# Patient Record
Sex: Male | Born: 1980 | Race: White | Hispanic: No | Marital: Single | Smoking: Current every day smoker
Health system: Southern US, Community
[De-identification: ages and names within clinical notes are randomized; demographics above are authoritative.]

## PROBLEM LIST (undated history)

## (undated) DIAGNOSIS — J45909 Unspecified asthma, uncomplicated: Secondary | ICD-10-CM

## (undated) DIAGNOSIS — E119 Type 2 diabetes mellitus without complications: Secondary | ICD-10-CM

## (undated) DIAGNOSIS — F1910 Other psychoactive substance abuse, uncomplicated: Secondary | ICD-10-CM

## (undated) DIAGNOSIS — F319 Bipolar disorder, unspecified: Secondary | ICD-10-CM

## (undated) DIAGNOSIS — I10 Essential (primary) hypertension: Secondary | ICD-10-CM

## (undated) HISTORY — PX: BACK SURGERY: SHX140

## (undated) HISTORY — PX: APPENDECTOMY: SHX54

## (undated) HISTORY — PX: CHOLECYSTECTOMY: SHX55

---

## 2007-12-23 ENCOUNTER — Ambulatory Visit: Payer: Self-pay | Admitting: Family Medicine

## 2008-10-01 ENCOUNTER — Ambulatory Visit: Payer: Self-pay | Admitting: Surgery

## 2008-10-04 ENCOUNTER — Ambulatory Visit: Payer: Self-pay | Admitting: Surgery

## 2009-03-24 ENCOUNTER — Emergency Department: Payer: Self-pay | Admitting: Emergency Medicine

## 2015-09-01 ENCOUNTER — Observation Stay
Admission: EM | Admit: 2015-09-01 | Discharge: 2015-09-02 | Disposition: A | Discharge status: Home / Self Care | Payer: Medicaid Other | Attending: Internal Medicine | Admitting: Internal Medicine

## 2015-09-01 ENCOUNTER — Encounter: Payer: Self-pay | Admitting: *Deleted

## 2015-09-01 ENCOUNTER — Emergency Department: Payer: Medicaid Other

## 2015-09-01 DIAGNOSIS — R253 Fasciculation: Secondary | ICD-10-CM | POA: Insufficient documentation

## 2015-09-01 DIAGNOSIS — I10 Essential (primary) hypertension: Secondary | ICD-10-CM | POA: Insufficient documentation

## 2015-09-01 DIAGNOSIS — R918 Other nonspecific abnormal finding of lung field: Secondary | ICD-10-CM | POA: Insufficient documentation

## 2015-09-01 DIAGNOSIS — R000 Tachycardia, unspecified: Secondary | ICD-10-CM | POA: Insufficient documentation

## 2015-09-01 DIAGNOSIS — R569 Unspecified convulsions: Principal | ICD-10-CM

## 2015-09-01 DIAGNOSIS — R55 Syncope and collapse: Secondary | ICD-10-CM | POA: Diagnosis present

## 2015-09-01 DIAGNOSIS — R4182 Altered mental status, unspecified: Secondary | ICD-10-CM | POA: Insufficient documentation

## 2015-09-01 DIAGNOSIS — J45909 Unspecified asthma, uncomplicated: Secondary | ICD-10-CM | POA: Insufficient documentation

## 2015-09-01 DIAGNOSIS — Z880 Allergy status to penicillin: Secondary | ICD-10-CM | POA: Insufficient documentation

## 2015-09-01 DIAGNOSIS — F17210 Nicotine dependence, cigarettes, uncomplicated: Secondary | ICD-10-CM | POA: Insufficient documentation

## 2015-09-01 DIAGNOSIS — M25572 Pain in left ankle and joints of left foot: Secondary | ICD-10-CM | POA: Diagnosis not present

## 2015-09-01 DIAGNOSIS — W19XXXA Unspecified fall, initial encounter: Secondary | ICD-10-CM | POA: Insufficient documentation

## 2015-09-01 HISTORY — DX: Unspecified asthma, uncomplicated: J45.909

## 2015-09-01 HISTORY — DX: Essential (primary) hypertension: I10

## 2015-09-01 LAB — COMPREHENSIVE METABOLIC PANEL
ALK PHOS: 123 U/L (ref 38–126)
ALT: 31 U/L (ref 17–63)
AST: 25 U/L (ref 15–41)
Albumin: 4.3 g/dL (ref 3.5–5.0)
Anion gap: 19 — ABNORMAL HIGH (ref 5–15)
BILIRUBIN TOTAL: 0.4 mg/dL (ref 0.3–1.2)
BUN: 11 mg/dL (ref 6–20)
CALCIUM: 9 mg/dL (ref 8.9–10.3)
CO2: 19 mmol/L — ABNORMAL LOW (ref 22–32)
CREATININE: 1.21 mg/dL (ref 0.61–1.24)
Chloride: 103 mmol/L (ref 101–111)
Glucose, Bld: 163 mg/dL — ABNORMAL HIGH (ref 65–99)
Potassium: 3.4 mmol/L — ABNORMAL LOW (ref 3.5–5.1)
Sodium: 141 mmol/L (ref 135–145)
TOTAL PROTEIN: 8.5 g/dL — AB (ref 6.5–8.1)

## 2015-09-01 LAB — FIBRIN DERIVATIVES D-DIMER (ARMC ONLY): FIBRIN DERIVATIVES D-DIMER (ARMC): 705 — AB (ref 0–499)

## 2015-09-01 LAB — TROPONIN I: TROPONIN I: 0.03 ng/mL (ref <0.031)

## 2015-09-01 LAB — CBC
HCT: 44.7 % (ref 40.0–52.0)
HEMOGLOBIN: 14.6 g/dL (ref 13.0–18.0)
MCH: 25.7 pg — AB (ref 26.0–34.0)
MCHC: 32.7 g/dL (ref 32.0–36.0)
MCV: 78.7 fL — ABNORMAL LOW (ref 80.0–100.0)
PLATELETS: 476 10*3/uL — AB (ref 150–440)
RBC: 5.69 MIL/uL (ref 4.40–5.90)
RDW: 15.1 % — ABNORMAL HIGH (ref 11.5–14.5)
WBC: 16 10*3/uL — AB (ref 3.8–10.6)

## 2015-09-01 LAB — GLUCOSE, CAPILLARY: Glucose-Capillary: 150 mg/dL — ABNORMAL HIGH (ref 65–99)

## 2015-09-01 MED ORDER — SODIUM CHLORIDE 0.9 % IV SOLN
Freq: Once | INTRAVENOUS | Status: AC
  Administered 2015-09-01: 22:00:00 via INTRAVENOUS

## 2015-09-01 MED ORDER — IOHEXOL 350 MG/ML SOLN
100.0000 mL | Freq: Once | INTRAVENOUS | Status: AC | PRN
  Administered 2015-09-01: 100 mL via INTRAVENOUS

## 2015-09-01 NOTE — ED Notes (Signed)
Pt sating 90-92% on room air, pt placed on 2L [place] by MD Carollee Massed at this time

## 2015-09-01 NOTE — ED Notes (Addendum)
Pt to ED via EMS after family witnessed "seizure like activity" Per EMS pt altered LOC, tachy, and recent "issues with hyperglycemia" Upon arrival pt tachy 147, awake and alert x 2 to person and place. Pt denies chest pain, SOB, lightheadedness or dizziness. Pt with no c/c at this time, denies all pain. Pt with no known hx of seizures, hx of hypertension and pill addiction. Current suboxone user. Pt BGL 150 upon arrival. Pt anxious, confused and "unsure of why here right now." MD Carollee Massed at bedside upon arrival.

## 2015-09-01 NOTE — ED Notes (Signed)
Pt returned from CT at this time.  

## 2015-09-01 NOTE — ED Notes (Signed)
MD Kaminski at bedside. 

## 2015-09-01 NOTE — ED Notes (Signed)
Patient transported to CT 

## 2015-09-01 NOTE — ED Provider Notes (Signed)
[facility] Emergency Department Provider Note  ____________________________________________  Time seen: 2150  I have reviewed the triage vital signs and the nursing notes.  History by:  Patient, then with family.  HISTORY  Chief Complaint Seizures; Tachycardia; and Altered Mental Status     HPI Joseph [name] is a 35 y.o. male who presents with an initially vague history. He reports he is here because his family called 911 and made him come because he had a fast heart rate. He reports he twisted his ankle earlier today and then stumbled again this evening. When he stumbled they called 911. He denies any loss of consciousness. He denies any palpitations or chest pain or other acute issues.  The family reports that when he stumbled he lost consciousness. He was unresponsive and "frothing at the mouth". He woke up some and tried to leave. Police and EMS arrived and he was getting resistant with them.  Apparently, the first glucose measurement read critical high, but EMS arrived and found a glucose of 170.   The patient's family and then the patient confirms, that he has a "spider bite" and has had a rash recently. He also has had swelling in both legs, but worse in the left.  He has a history of hypertension. He also is on Suboxone treatment.    Past Medical History  Diagnosis Date  . Hypertension     There are no active problems to display for this patient.   Past Surgical History  Procedure Laterality Date  . Back surgery      Current Outpatient Rx  Name  Route  Sig  Dispense  Refill  . gabapentin (NEURONTIN) 300 MG capsule   Oral   Take 300-600 mg by mouth 2 (two) times daily.         Marland Kitchen LISINOPRIL PO   Oral   Take 1 tablet by mouth daily.         . SUBOXONE 8-2 MG FILM   Sublingual   Place 1 Film under the tongue 3 (three) times daily.           Dispense as written.   . TRIAMTERENE-HCTZ PO   Oral   Take 1 tablet by mouth  daily. Patient states takes a HCTZ combo tablet that is green, this is only known tablet with that combination. Asked if metoprolol, losartan, bisoprolol, etc. Sounded familiar but patient only knew color and that HCTZ was in it.           Allergies Penicillins  History reviewed. No pertinent family history.  Social History Social History  Substance Use Topics  . Smoking status: Current Every Day Smoker -- 0.50 packs/day    Types: Cigarettes  . Smokeless tobacco: None  . Alcohol Use: No    Review of Systems  Constitutional: Negative for fever/chills. ENT: Negative for congestion. Cardiovascular: Negative for chest pain or palpitations. Noted tachycardia on arrival. Respiratory: Negative for cough. Gastrointestinal: Negative for abdominal pain, vomiting and diarrhea. Genitourinary: Negative for dysuria. Musculoskeletal: No back pain. Skin: Diaphoresis with the episode tonight. Recent "spider bite" and some rash/hives. Neurological: Patient denies headache or weakness. Some question of seizure or syncope tonight.   10-point ROS otherwise negative.  ____________________________________________   PHYSICAL EXAM:  VITAL SIGNS: ED Triage Vitals  Enc Vitals Group     BP 09/01/15 2141 110/91 mmHg     Pulse Rate 09/01/15 2141 148     Resp 09/01/15 2141 23     Temp  09/01/15 2141 98.8 F (37.1 C)     Temp Source 09/01/15 2141 Oral     SpO2 09/01/15 2141 94 %     Weight 09/01/15 2141 440 lb (199.583 kg)     Height 09/01/15 2141  (1.88 m)     Head Cir --      Peak Flow --      Pain Score --      Pain Loc --      Pain Edu? --      Excl. in GC? --     Constitutional: Alert and oriented. Tachycardic, a little diaphoretic, but in no acute distress. ENT   Head: Normocephalic and atraumatic.   Nose: No congestion/rhinnorhea.       Mouth: No erythema, no swelling   Cardiovascular: Tachycardic at 125 to 1:30, regular rhythm, no murmur noted Respiratory:  Normal  respiratory effort, no tachypnea.    Breath sounds are clear and equal bilaterally.  Gastrointestinal: Soft, no distention. Nontender Back: No muscle spasm, no tenderness, no CVA tenderness. Musculoskeletal: Minimal tenderness to the medial portion of the left ankle. Large lower legs without any pitting. No gross deformities. Neurologic:  Communicative. Patient appears have no recollection of the high acuity of the events of the house. He has good cognition and remains calm as he hears the family's report and concerns. Normal appearing spontaneous movement in all 4 extremities. No gross focal neurologic deficits are appreciated.  Skin:  Skin is warm, dry. No rash noted. Psychiatric: Mood and affect are normal. Speech and behavior are normal.  ____________________________________________    LABS (pertinent positives/negatives)  Labs Reviewed  GLUCOSE, CAPILLARY - Abnormal; Notable for the following:    Glucose-Capillary 150 (*)    All other components within normal limits  COMPREHENSIVE METABOLIC PANEL - Abnormal; Notable for the following:    Potassium 3.4 (*)    CO2 19 (*)    Glucose, Bld 163 (*)    Total Protein 8.5 (*)    Anion gap 19 (*)    All other components within normal limits  CBC - Abnormal; Notable for the following:    WBC 16.0 (*)    MCV 78.7 (*)    MCH 25.7 (*)    RDW 15.1 (*)    Platelets 476 (*)    All other components within normal limits  FIBRIN DERIVATIVES D-DIMER ([facility] ONLY) - Abnormal; Notable for the following:    Fibrin derivatives D-dimer ([facility]) 705 (*)    All other components within normal limits  TROPONIN I  URINE DRUG SCREEN, QUALITATIVE ([facility] ONLY)  CBG MONITORING, ED     ____________________________________________   EKG  ED ECG REPORT I, Rylah Fukuda W, the attending physician, personally viewed and interpreted this ECG.   Date: 09/01/2015  EKG Time: 2139  Rate: 147  Rhythm: Sinus tachycardia  Axis: Normal  Intervals: QTC of 502   ST&T Change: T-wave is flat negative in lead 32 and aVF.  Abnormal EKG ____________________________________________    RADIOLOGY  Chest x-ray: FINDINGS: The lungs are clear. Heart size is normal. No pneumothorax or pleural effusion. No focal bony abnormality.  IMPRESSION: Negative chest. ____________________________________________   PROCEDURES  CRITICAL CARE Performed by: Darien Ramus   Total critical care time: 35 minutes  Critical care time was exclusive of separately billable procedures and treating other patients.  Critical care was necessary to treat or prevent imminent or life-threatening deterioration.  Critical care was time spent personally by me on the following activities: development  of treatment plan with patient and/or surrogate as well as nursing, discussions with consultants, evaluation of patient's response to treatment, examination of patient, obtaining history from patient or surrogate, ordering and performing treatments and interventions, ordering and review of laboratory studies, ordering and review of radiographic studies, pulse oximetry and re-evaluation of patient's condition.   ____________________________________________   INITIAL IMPRESSION / ASSESSMENT AND PLAN / ED COURSE  Pertinent labs & imaging results that were available during my care of the patient were reviewed by me and considered in my medical decision making (see chart for details).  Unclear cause of an unclear event and a 35 year old male. He has ongoing tachycardia without chest pain or palpitations. I believe he had either seizure or syncopal episode. With the fast heart rate and borderline blood pressure, I am concerned about various possible causes for hypotension, including possibilities related to the "spider bite". He could have an infectious or allergic cause. With swelling of the leg he could also have possible DVT and  PE. We're checking cardiac enzyme, d-dimer, and other  lab tests. Chest x-ray and ankle x-ray are pending.  ----------------------------------------- 11:23 PM on 09/01/2015 -----------------------------------------  Patient does have a positive d-dimer. This, in conjunction with his tachycardia, leads me to evaluate him further with a CT angiogram of his chest. This is been ordered. I discussed case my colleague, Dr. Manson Passey, who will assume care of the patient this time.  ____________________________________________   FINAL CLINICAL IMPRESSION(S) / ED DIAGNOSES  Final diagnoses:  Syncope and collapse  Tachycardia      Darien Ramus, MD 09/01/15 2325

## 2015-09-01 NOTE — ED Notes (Signed)
Pt requesting for administration of Subonxoe, MD Carollee Massed made aware, no further orders given at this time.

## 2015-09-02 ENCOUNTER — Encounter: Payer: Self-pay | Admitting: Internal Medicine

## 2015-09-02 DIAGNOSIS — R569 Unspecified convulsions: Secondary | ICD-10-CM

## 2015-09-02 LAB — URINE DRUG SCREEN, QUALITATIVE (ARMC ONLY)
Amphetamines, Ur Screen: NOT DETECTED
Barbiturates, Ur Screen: NOT DETECTED
Benzodiazepine, Ur Scrn: NOT DETECTED
COCAINE METABOLITE, UR ~~LOC~~: POSITIVE — AB
Cannabinoid 50 Ng, Ur ~~LOC~~: POSITIVE — AB
MDMA (Ecstasy)Ur Screen: NOT DETECTED
Methadone Scn, Ur: NOT DETECTED
Opiate, Ur Screen: NOT DETECTED
Phencyclidine (PCP) Ur S: NOT DETECTED
TRICYCLIC, UR SCREEN: NOT DETECTED

## 2015-09-02 LAB — TROPONIN I
Troponin I: 0.05 ng/mL — ABNORMAL HIGH (ref <0.031)
Troponin I: <0.03 ng/mL (ref <0.031)

## 2015-09-02 MED ORDER — GABAPENTIN 300 MG PO CAPS
600.0000 mg | ORAL_CAPSULE | Freq: Two times a day (BID) | ORAL | Status: DC
  Administered 2015-09-02: 600 mg via ORAL
  Filled 2015-09-02: qty 2

## 2015-09-02 MED ORDER — GABAPENTIN 300 MG PO CAPS
300.0000 mg | ORAL_CAPSULE | Freq: Every evening | ORAL | Status: DC | PRN
  Administered 2015-09-02: 300 mg via ORAL
  Filled 2015-09-02: qty 1

## 2015-09-02 MED ORDER — ONDANSETRON HCL 4 MG/2ML IJ SOLN: 4.0000 mg | Freq: Four times a day (QID) | INTRAMUSCULAR | Status: DC | PRN

## 2015-09-02 MED ORDER — ONDANSETRON HCL 4 MG PO TABS: 4.0000 mg | ORAL_TABLET | Freq: Four times a day (QID) | ORAL | Status: DC | PRN

## 2015-09-02 MED ORDER — BUPRENORPHINE HCL 8 MG SL SUBL
8.0000 mg | SUBLINGUAL_TABLET | Freq: Once | SUBLINGUAL | Status: AC
  Administered 2015-09-02: 8 mg via SUBLINGUAL
  Filled 2015-09-02: qty 1

## 2015-09-02 MED ORDER — BUPRENORPHINE HCL 8 MG SL SUBL
8.0000 mg | SUBLINGUAL_TABLET | Freq: Three times a day (TID) | SUBLINGUAL | Status: DC
  Administered 2015-09-02: 8 mg via SUBLINGUAL
  Filled 2015-09-02: qty 1

## 2015-09-02 MED ORDER — GABAPENTIN 300 MG PO CAPS: 300.0000 mg | ORAL_CAPSULE | Freq: Two times a day (BID) | ORAL | Status: DC

## 2015-09-02 MED ORDER — ENOXAPARIN SODIUM 40 MG/0.4ML ~~LOC~~ SOLN
40.0000 mg | Freq: Two times a day (BID) | SUBCUTANEOUS | Status: DC
  Administered 2015-09-02: 40 mg via SUBCUTANEOUS
  Filled 2015-09-02: qty 0.4

## 2015-09-02 MED ORDER — SODIUM CHLORIDE 0.9% FLUSH
3.0000 mL | Freq: Two times a day (BID) | INTRAVENOUS | Status: DC
  Administered 2015-09-02 (×2): 3 mL via INTRAVENOUS

## 2015-09-02 MED ORDER — ACETAMINOPHEN 650 MG RE SUPP: 650.0000 mg | Freq: Four times a day (QID) | RECTAL | Status: DC | PRN

## 2015-09-02 MED ORDER — LISINOPRIL 5 MG PO TABS
5.0000 mg | ORAL_TABLET | Freq: Every day | ORAL | Status: DC
  Administered 2015-09-02: 5 mg via ORAL
  Filled 2015-09-02: qty 1

## 2015-09-02 MED ORDER — ACETAMINOPHEN 325 MG PO TABS
650.0000 mg | ORAL_TABLET | Freq: Four times a day (QID) | ORAL | Status: DC | PRN
  Administered 2015-09-02: 650 mg via ORAL
  Filled 2015-09-02: qty 2

## 2015-09-02 NOTE — ED Provider Notes (Signed)
Assumed care of the patient 11:20 PM from Dr. Carollee Massed. CT scan of the chest PE protocol revealed no evidence of pulmonary emboli: CT Angio Chest PE W/Cm &/Or Wo Cm (Final result) Result time: 09/02/15 00:06:18   Final result by Rad Results In Interface (09/02/15 00:06:18)   Narrative:   CLINICAL DATA: Tachycardia and positive D-dimer. Seizure-like activity. Altered level of consciousness.  EXAM: CT ANGIOGRAPHY CHEST WITH CONTRAST  TECHNIQUE: Multidetector CT imaging of the chest was performed using the standard protocol during bolus administration of intravenous contrast. Multiplanar CT image reconstructions and MIPs were obtained to evaluate the vascular anatomy.  CONTRAST: OMNIPAQUE IOHEXOL 350 MG/ML SOLN  COMPARISON: None.  FINDINGS: Technically adequate study with good opacification of the central and proximal segmental pulmonary arteries. No focal filling defects are demonstrated. No evidence of significant pulmonary embolus.  Normal heart size. Normal caliber thoracic aorta. Great vessel origins appear patent. Esophagus is decompressed. No significant lymphadenopathy in the chest.  Patchy nodular infiltrates in the superior segment of the left lower lobe the. These are likely to represent inflammatory process. Suggest follow-up CT in 3 months to exclude underlying nodularity. No other focal areas of consolidation or infiltration. No pleural effusions. No pneumothorax.  Included portions of the upper abdominal organs are grossly unremarkable. No destructive bone lesions.  Review of the MIP images confirms the above findings.  IMPRESSION: No evidence of significant pulmonary embolus. Patchy nodular infiltration demonstrated in the superior segment of left lower lung likely representing inflammatory process or pneumonia. Suggest follow-up examination in 3 months to exclude permanent underlying nodules.   Electronically Signed By: Burman Nieves  M.D. On: 09/02/2015 00:06          DG Chest 1 View (Final result) Result time: 09/01/15 22:46:51   Final result by Rad Results In Interface (09/01/15 22:46:51)   Narrative:   CLINICAL DATA: Unwitnessed fall and seizure like activity today. Initial encounter.  EXAM: CHEST 1 VIEW  COMPARISON: None.  FINDINGS: The lungs are clear. Heart size is normal. No pneumothorax or pleural effusion. No focal bony abnormality.  IMPRESSION: Negative chest.   Electronically Signed By: Drusilla Kanner M.D. On: 09/01/2015 22:46          DG Ankle 2 Views Left (Final result) Result time: 09/01/15 22:47:19   Final result by Rad Results In Interface (09/01/15 22:47:19)   Narrative:   CLINICAL DATA: Pain after a fall today.  EXAM: LEFT ANKLE - 2 VIEW  COMPARISON: None.  FINDINGS: There is no evidence of fracture, dislocation, or joint effusion. There is no evidence of arthropathy or other focal bone abnormality. Soft tissues are unremarkable.  IMPRESSION: Negative.   Electronically Signed By: Burman Nieves M.D. On: 09/01/2015 22:47      As per Dr. Leata Mouse recommendation given presenting complaint of syncope & tachycardia patient will be admitted for further evaluation and management  Darci Current, MD 09/02/15 [phone]

## 2015-09-02 NOTE — Progress Notes (Signed)
Notified dr.diamond of critical troponin 0.05. No new orders.

## 2015-09-02 NOTE — Discharge Summary (Signed)
Joseph [name], 35 y.o., DOB [birth year], MRN [id number]. Admission date: 09/01/2015 Discharge Date 09/02/2015 Primary MD [facility] Admitting Physician Wyatt Haste, MD  Admission Diagnosis  Syncope and collapse [R55] Tachycardia [R00.0]  Discharge Diagnosis   Active Problems:   Seizure [facility])  substance abuse with cocaine use Tachycardia Elevated blood pressure       Hospital Course Joseph [name] is a 35 y.o. male with a known history of essential hypertension, history of substance abuse presenting after being found unresponsive. Patient's family described an episode and found him sitting on the couch unresponsive, slumped over, from the mouth with generalized twitching. Had post ictal episode lasting for 15 minutes where he was apparently agitated and combative. No tongue biting, loss of bladder or bowel function. On arrival to emergency department noted to be markedly tachycardic 140s to 150s. Patient was placed under observation. He was awake at the time of my evaluation his heart rate blood pressure all normal. Patient does endorse using cocaine day before. His toxic urine drug screen was positive for that. I discussed the case with the neurologist they recommended the patient will likely need an EEG as outpatient. But since is his first episode he does not need any antiepileptics. I strongly recommended patient be F Stennett from drug use which can cause seizures can cause tachycardia can cause elevated blood pressure. He states that he goes to the Suboxone clinic in [place]. And states that if he didn't get there by 2 PM he will not get that medication for a week.             Consults  None  Significant Tests:  See full reports for all details      Dg Chest 1 View  09/01/2015  CLINICAL DATA:  Unwitnessed fall and seizure like activity today. Initial encounter. EXAM: CHEST 1 VIEW COMPARISON:  None. FINDINGS: The lungs are clear. Heart size is normal. No  pneumothorax or pleural effusion. No focal bony abnormality. IMPRESSION: Negative chest. Electronically Signed   By: Drusilla Kanner M.D.   On: 09/01/2015 22:46   Dg Ankle 2 Views Left  09/01/2015  CLINICAL DATA:  Pain after a fall today. EXAM: LEFT ANKLE - 2 VIEW COMPARISON:  None. FINDINGS: There is no evidence of fracture, dislocation, or joint effusion. There is no evidence of arthropathy or other focal bone abnormality. Soft tissues are unremarkable. IMPRESSION: Negative. Electronically Signed   By: Burman Nieves M.D.   On: 09/01/2015 22:47   Ct Angio Chest Pe W/cm &/or Wo Cm  09/02/2015  CLINICAL DATA:  Tachycardia and positive D-dimer. Seizure-like activity. Altered level of consciousness. EXAM: CT ANGIOGRAPHY CHEST WITH CONTRAST TECHNIQUE: Multidetector CT imaging of the chest was performed using the standard protocol during bolus administration of intravenous contrast. Multiplanar CT image reconstructions and MIPs were obtained to evaluate the vascular anatomy. CONTRAST:  OMNIPAQUE IOHEXOL 350 MG/ML SOLN COMPARISON:  None. FINDINGS: Technically adequate study with good opacification of the central and proximal segmental pulmonary arteries. No focal filling defects are demonstrated. No evidence of significant pulmonary embolus. Normal heart size. Normal caliber thoracic aorta. Great vessel origins appear patent. Esophagus is decompressed. No significant lymphadenopathy in the chest. Patchy nodular infiltrates in the superior segment of the left lower lobe the. These are likely to represent inflammatory process. Suggest follow-up CT in 3 months to exclude underlying nodularity. No other focal areas of consolidation or infiltration. No pleural effusions. No pneumothorax. Included portions of the upper abdominal  organs are grossly unremarkable. No destructive bone lesions. Review of the MIP images confirms the above findings. IMPRESSION: No evidence of significant pulmonary embolus. Patchy  nodular infiltration demonstrated in the superior segment of left lower lung likely representing inflammatory process or pneumonia. Suggest follow-up examination in 3 months to exclude permanent underlying nodules. Electronically Signed   By: Burman Nieves M.D.   On: 09/02/2015 00:06       Today   Subjective:   Joseph [name] feeling well denies any complaints  Objective:   Blood pressure 131/79, pulse 89, temperature 98.4 F (36.9 C), temperature source Oral, resp. rate 17, height  (1.88 m), weight 172.911 kg (381 lb 3.2 oz), SpO2 98 %.  .  Intake/Output Summary (Last 24 hours) at 09/02/15 1534 Last data filed at 09/02/15 0600  Gross per 24 hour  Intake    483 ml  Output      0 ml  Net    483 ml    Exam VITAL SIGNS: Blood pressure 131/79, pulse 89, temperature 98.4 F (36.9 C), temperature source Oral, resp. rate 17, height  (1.88 m), weight 172.911 kg (381 lb 3.2 oz), SpO2 98 %.  GENERAL:  35 y.o.-year-old patient lying in the bed with no acute distress.  EYES: Pupils equal, round, reactive to light and accommodation. No scleral icterus. Extraocular muscles intact.  HEENT: Head atraumatic, normocephalic. Oropharynx and nasopharynx clear.  NECK:  Supple, no jugular venous distention. No thyroid enlargement, no tenderness.  LUNGS: Normal breath sounds bilaterally, no wheezing, rales,rhonchi or crepitation. No use of accessory muscles of respiration.  CARDIOVASCULAR: S1, S2 normal. No murmurs, rubs, or gallops.  ABDOMEN: Soft, nontender, nondistended. Bowel sounds present. No organomegaly or mass.  EXTREMITIES: No pedal edema, cyanosis, or clubbing.  NEUROLOGIC: Cranial nerves II through XII are intact. Muscle strength 5/5 in all extremities. Sensation intact. Gait not checked.  PSYCHIATRIC: The patient is alert and oriented x 3.  SKIN: No obvious rash, lesion, or ulcer.   Data Review     CBC w Diff: Lab Results  Component Value Date   WBC 16.0* 09/01/2015    HGB 14.6 09/01/2015   HCT 44.7 09/01/2015   PLT 476* 09/01/2015   CMP: Lab Results  Component Value Date   NA 141 09/01/2015   K 3.4* 09/01/2015   CL 103 09/01/2015   CO2 19* 09/01/2015   BUN 11 09/01/2015   CREATININE 1.21 09/01/2015   PROT 8.5* 09/01/2015   ALBUMIN 4.3 09/01/2015   BILITOT 0.4 09/01/2015   ALKPHOS 123 09/01/2015   AST 25 09/01/2015   ALT 31 09/01/2015  .  Micro Results No results found for this or any previous visit (from the past 240 hour(s)).      Code Status Orders        Start     Ordered   09/02/15 0050  Full code   Continuous     09/02/15 0049    Code Status History    Date Active Date Inactive Code Status Order ID Comments User Context   This patient has a current code status but no historical code status.          Follow-up Information    Follow up with [facility] Marylou Mccoy, MD In 7 days.   Specialty:  Neurology   Why:  new onset seizure possible   Contact information:   [address] [facility] [place] [address] [phone]       Discharge  Medications     Medication List    TAKE these medications        gabapentin 300 MG capsule  Commonly known as:  NEURONTIN  Take 300-600 mg by mouth 2 (two) times daily.     LISINOPRIL PO  Take 1 tablet by mouth daily.     SUBOXONE 8-2 MG Film  Generic drug:  Buprenorphine HCl-Naloxone HCl  Place 1 Film under the tongue 3 (three) times daily.     TRIAMTERENE-HCTZ PO  Take 1 tablet by mouth daily. Patient states takes a HCTZ combo tablet that is green, this is only known tablet with that combination. Asked if metoprolol, losartan, bisoprolol, etc. Sounded familiar but patient only knew color and that HCTZ was in it.           Total Time in preparing paper work, data evaluation and todays exam - 35 minutes  Auburn Bilberry M.D on 09/02/2015 at 3:34 PM  [facility]   Office  [phone]

## 2015-09-02 NOTE — H&P (Signed)
[facility] - [place] at [facility]   PATIENT NAME: Joseph [name]    MR#:  [id number]  DATE OF BIRTH:  1980/09/06   DATE OF ADMISSION:  09/01/2015  PRIMARY CARE PHYSICIAN: [facility]   REQUESTING/REFERRING PHYSICIAN: Manson Passey  CHIEF COMPLAINT:   Chief Complaint  Patient presents with  . Seizures  . Tachycardia  . Altered Mental Status    HISTORY OF PRESENT ILLNESS:  Joseph [name]  is a 35 y.o. male with a known history of essential hypertension, history of substance abuse presenting after being found unresponsive. Patient's family described an episode and found him sitting on the couch unresponsive, slumped over, from the mouth with generalized twitching. Had post ictal episode lasting for 15 minutes where he was apparently agitated and combative. No tongue biting, loss of bladder or bowel function. On arrival to emergency department noted to be markedly tachycardic 140s to 150s Of note earlier today had mechanical fall describing acute pain left ankle "pain" 5/10 worst movement no relieving factors  PAST MEDICAL HISTORY:   Past Medical History  Diagnosis Date  . Hypertension   . Asthma     PAST SURGICAL HISTORY:   Past Surgical History  Procedure Laterality Date  . Back surgery      SOCIAL HISTORY:   Social History  Substance Use Topics  . Smoking status: Current Every Day Smoker -- 0.50 packs/day    Types: Cigarettes  . Smokeless tobacco: Not on file  . Alcohol Use: No    FAMILY HISTORY:   Family History  Problem Relation Age of Onset  . Hypertension Other     DRUG ALLERGIES:   Allergies  Allergen Reactions  . Penicillins Rash    Has patient had a PCN reaction causing immediate rash, facial/tongue/throat swelling, SOB or lightheadedness with hypotension: Yes Has patient had a PCN reaction causing severe rash involving mucus membranes or skin necrosis: No Has patient had a PCN reaction that required  hospitalization No Has patient had a PCN reaction occurring within the last 10 years: No If all of the above answers are "NO", then may proceed with Cephalosporin use.    REVIEW OF SYSTEMS:  REVIEW OF SYSTEMS:  CONSTITUTIONAL: Denies fevers, chills, fatigue, weakness.  EYES: Denies blurred vision, double vision, or eye pain.  EARS, NOSE, THROAT: Denies tinnitus, ear pain, hearing loss.  RESPIRATORY: denies cough, shortness of breath, wheezing  CARDIOVASCULAR: Denies chest pain, palpitations, edema.  GASTROINTESTINAL: Denies nausea, vomiting, diarrhea, abdominal pain.  GENITOURINARY: Denies dysuria, hematuria.  ENDOCRINE: Denies nocturia or thyroid problems. HEMATOLOGIC AND LYMPHATIC: Denies easy bruising or bleeding.  SKIN: Denies rash or lesions.  MUSCULOSKELETAL: Denies pain in neck, back, shoulder, knees, hips, or further arthritic symptoms. Positive left ankle pain NEUROLOGIC: Denies paralysis, paresthesias.  PSYCHIATRIC: Denies anxiety or depressive symptoms. Otherwise full review of systems performed by me is negative.   MEDICATIONS AT HOME:   Prior to Admission medications   Medication Sig Start Date End Date Taking? Authorizing Provider  gabapentin (NEURONTIN) 300 MG capsule Take 300-600 mg by mouth 2 (two) times daily.   Yes Historical Provider, MD  LISINOPRIL PO Take 1 tablet by mouth daily.   Yes Historical Provider, MD  SUBOXONE 8-2 MG FILM Place 1 Film under the tongue 3 (three) times daily.   Yes Historical Provider, MD  TRIAMTERENE-HCTZ PO Take 1 tablet by mouth daily. Patient states takes a HCTZ combo tablet that is green, this is only known tablet with that  combination. Asked if metoprolol, losartan, bisoprolol, etc. Sounded familiar but patient only knew color and that HCTZ was in it.   Yes Historical Provider, MD      VITAL SIGNS:  Blood pressure 110/65, pulse 89, temperature 98.8 F (37.1 C), temperature source Oral, resp. rate 16, height  (1.88 m), weight  440 lb (199.583 kg), SpO2 95 %.  PHYSICAL EXAMINATION:  VITAL SIGNS: Filed Vitals:   09/01/15 2252 09/02/15 0027  BP: 114/64 110/65  Pulse: 94 89  Temp:    Resp: 16 16   GENERAL:35 y.o.male currently in no acute distress.  HEAD: Normocephalic, atraumatic.  EYES: Pupils equal, round, reactive to light. Extraocular muscles intact. No scleral icterus.  MOUTH: Moist mucosal membrane. Dentition intact. No abscess noted.  EAR, NOSE, THROAT: Clear without exudates. No external lesions.  NECK: Supple. No thyromegaly. No nodules. No JVD.  PULMONARY: Clear to ascultation, without wheeze rails or rhonci. No use of accessory muscles, Good respiratory effort. good air entry bilaterally CHEST: Nontender to palpation.  CARDIOVASCULAR: S1 and S2. Regular rate and rhythm. No murmurs, rubs, or gallops. No edema. Pedal pulses 2+ bilaterally.  GASTROINTESTINAL: Soft, nontender, nondistended. No masses. Positive bowel sounds. No hepatosplenomegaly.  MUSCULOSKELETAL: Left ankle swelling involving both medial and lateral malleoli, otherwise No swelling, clubbing, or edema. Range of motion full in all extremities.  NEUROLOGIC: Cranial nerves II through XII are intact. No gross focal neurological deficits. Sensation intact. Reflexes intact.  SKIN: No ulceration, lesions, rashes, or cyanosis. Skin warm and dry. Turgor intact.  PSYCHIATRIC: Mood, affect within normal limits. The patient is awake, alert and oriented x 3. Insight, judgment intact.    LABORATORY PANEL:   CBC  Recent Labs Lab 09/01/15 2141  WBC 16.0*  HGB 14.6  HCT 44.7  PLT 476*   ------------------------------------------------------------------------------------------------------------------  Chemistries   Recent Labs Lab 09/01/15 2141  NA 141  K 3.4*  CL 103  CO2 19*  GLUCOSE 163*  BUN 11  CREATININE 1.21  CALCIUM 9.0  AST 25  ALT 31  ALKPHOS 123  BILITOT 0.4    ------------------------------------------------------------------------------------------------------------------  Cardiac Enzymes  Recent Labs Lab 09/01/15 2141  TROPONINI 0.03   ------------------------------------------------------------------------------------------------------------------  RADIOLOGY:  Dg Chest 1 View  09/01/2015  CLINICAL DATA:  Unwitnessed fall and seizure like activity today. Initial encounter. EXAM: CHEST 1 VIEW COMPARISON:  None. FINDINGS: The lungs are clear. Heart size is normal. No pneumothorax or pleural effusion. No focal bony abnormality. IMPRESSION: Negative chest. Electronically Signed   By: Drusilla Kanner M.D.   On: 09/01/2015 22:46   Dg Ankle 2 Views Left  09/01/2015  CLINICAL DATA:  Pain after a fall today. EXAM: LEFT ANKLE - 2 VIEW COMPARISON:  None. FINDINGS: There is no evidence of fracture, dislocation, or joint effusion. There is no evidence of arthropathy or other focal bone abnormality. Soft tissues are unremarkable. IMPRESSION: Negative. Electronically Signed   By: Burman Nieves M.D.   On: 09/01/2015 22:47   Ct Angio Chest Pe W/cm &/or Wo Cm  09/02/2015  CLINICAL DATA:  Tachycardia and positive D-dimer. Seizure-like activity. Altered level of consciousness. EXAM: CT ANGIOGRAPHY CHEST WITH CONTRAST TECHNIQUE: Multidetector CT imaging of the chest was performed using the standard protocol during bolus administration of intravenous contrast. Multiplanar CT image reconstructions and MIPs were obtained to evaluate the vascular anatomy. CONTRAST:  OMNIPAQUE IOHEXOL 350 MG/ML SOLN COMPARISON:  None. FINDINGS: Technically adequate study with good opacification of the central and proximal segmental pulmonary arteries. No  focal filling defects are demonstrated. No evidence of significant pulmonary embolus. Normal heart size. Normal caliber thoracic aorta. Great vessel origins appear patent. Esophagus is decompressed. No significant lymphadenopathy  in the chest. Patchy nodular infiltrates in the superior segment of the left lower lobe the. These are likely to represent inflammatory process. Suggest follow-up CT in 3 months to exclude underlying nodularity. No other focal areas of consolidation or infiltration. No pleural effusions. No pneumothorax. Included portions of the upper abdominal organs are grossly unremarkable. No destructive bone lesions. Review of the MIP images confirms the above findings. IMPRESSION: No evidence of significant pulmonary embolus. Patchy nodular infiltration demonstrated in the superior segment of left lower lung likely representing inflammatory process or pneumonia. Suggest follow-up examination in 3 months to exclude permanent underlying nodules. Electronically Signed   By: Burman Nieves M.D.   On: 09/02/2015 00:06    EKG:   Orders placed or performed during the hospital encounter of 09/01/15  . ED EKG within 10 minutes  . ED EKG within 10 minutes    IMPRESSION AND PLAN:   35 year old Caucasian gentleman history of substance abuse presenting after unresponsive episode  1. Seizure: Episode does sound suspicious for seizure activity, cocaine toxicity: given urine drug screen just returned positive for cocaine this would describe seizure, tachycardia. Place on seizure precautions 2. Essential hypertension: Start low-dose lisinopril 3. Venous thromboembolism prophylactic: Heparin subcutaneous    All the records are reviewed and case discussed with ED provider. Management plans discussed with the patient, family and they are in agreement.  CODE STATUS: Full  TOTAL TIME TAKING CARE OF THIS PATIENT: 40 minutes.    Hower,  Mardi Mainland.D on 09/02/2015 at 1:24 AM  Between 7am to 6pm - Pager - [phone]  After 6pm: House Pager: - [phone]  [facility]  Office  [phone]  CC: Primary care physician; [facility]

## 2015-09-02 NOTE — ED Notes (Signed)
MD Hower at bedside. 

## 2015-09-02 NOTE — Discharge Instructions (Signed)
°  DIET:  Cardiac diet  DISCHARGE CONDITION:  Stable  ACTIVITY:  Activity as tolerated  OXYGEN:  Home Oxygen: none   Oxygen Delivery: room air  DISCHARGE LOCATION:  home    ADDITIONAL DISCHARGE INSTRUCTION:   If you experience worsening of your admission symptoms, develop shortness of breath, life threatening emergency, suicidal or homicidal thoughts you must seek medical attention immediately by calling 911 or calling your MD immediately  if symptoms less severe.  You Must read complete instructions/literature along with all the possible adverse reactions/side effects for all the Medicines you take and that have been prescribed to you. Take any new Medicines after you have completely understood and accpet all the possible adverse reactions/side effects.   Please note  You were cared for by a hospitalist during your hospital stay. If you have any questions about your discharge medications or the care you received while you were in the hospital after you are discharged, you can call the unit and asked to speak with the hospitalist on call if the hospitalist that took care of you is not available. Once you are discharged, your primary care physician will handle any further medical issues. Please note that NO REFILLS for any discharge medications will be authorized once you are discharged, as it is imperative that you return to your primary care physician (or establish a relationship with a primary care physician if you do not have one) for your aftercare needs so that they can reassess your need for medications and monitor your lab values.

## 2015-09-03 LAB — PROLACTIN: Prolactin: 2.7 ng/mL — ABNORMAL LOW (ref 4.0–15.2)

## 2016-04-29 ENCOUNTER — Emergency Department
Admission: EM | Admit: 2016-04-29 | Discharge: 2016-04-29 | Disposition: A | Discharge status: Home / Self Care | Payer: Medicaid Other | Attending: Emergency Medicine | Admitting: Emergency Medicine

## 2016-04-29 ENCOUNTER — Emergency Department: Payer: Medicaid Other

## 2016-04-29 DIAGNOSIS — F17210 Nicotine dependence, cigarettes, uncomplicated: Secondary | ICD-10-CM | POA: Insufficient documentation

## 2016-04-29 DIAGNOSIS — G40909 Epilepsy, unspecified, not intractable, without status epilepticus: Secondary | ICD-10-CM | POA: Insufficient documentation

## 2016-04-29 DIAGNOSIS — R569 Unspecified convulsions: Secondary | ICD-10-CM | POA: Diagnosis present

## 2016-04-29 DIAGNOSIS — I10 Essential (primary) hypertension: Secondary | ICD-10-CM | POA: Insufficient documentation

## 2016-04-29 DIAGNOSIS — J45909 Unspecified asthma, uncomplicated: Secondary | ICD-10-CM | POA: Diagnosis not present

## 2016-04-29 LAB — CBC WITH DIFFERENTIAL/PLATELET
Basophils Absolute: 0.1 10*3/uL (ref 0–0.1)
Basophils Relative: 0 %
Eosinophils Absolute: 0 10*3/uL (ref 0–0.7)
Eosinophils Relative: 0 %
HEMATOCRIT: 45.5 % (ref 40.0–52.0)
HEMOGLOBIN: 15.1 g/dL (ref 13.0–18.0)
LYMPHS ABS: 1.9 10*3/uL (ref 1.0–3.6)
LYMPHS PCT: 11 %
MCH: 26.8 pg (ref 26.0–34.0)
MCHC: 33.2 g/dL (ref 32.0–36.0)
MCV: 80.5 fL (ref 80.0–100.0)
Monocytes Absolute: 0.7 10*3/uL (ref 0.2–1.0)
Monocytes Relative: 4 %
NEUTROS ABS: 14.9 10*3/uL — AB (ref 1.4–6.5)
Neutrophils Relative %: 85 %
Platelets: 342 10*3/uL (ref 150–440)
RBC: 5.66 MIL/uL (ref 4.40–5.90)
RDW: 14.9 % — ABNORMAL HIGH (ref 11.5–14.5)
WBC: 17.7 10*3/uL — AB (ref 3.8–10.6)

## 2016-04-29 LAB — LACTIC ACID, PLASMA: Lactic Acid, Venous: 2.1 mmol/L (ref 0.5–1.9)

## 2016-04-29 LAB — URINE DRUG SCREEN, QUALITATIVE (ARMC ONLY)
AMPHETAMINES, UR SCREEN: NOT DETECTED
BENZODIAZEPINE, UR SCRN: NOT DETECTED
Barbiturates, Ur Screen: NOT DETECTED
CANNABINOID 50 NG, UR ~~LOC~~: NOT DETECTED
Cocaine Metabolite,Ur ~~LOC~~: NOT DETECTED
MDMA (Ecstasy)Ur Screen: NOT DETECTED
Methadone Scn, Ur: NOT DETECTED
Opiate, Ur Screen: NOT DETECTED
PHENCYCLIDINE (PCP) UR S: NOT DETECTED
Tricyclic, Ur Screen: NOT DETECTED

## 2016-04-29 LAB — URINALYSIS COMPLETE WITH MICROSCOPIC (ARMC ONLY)
Bilirubin Urine: NEGATIVE
Glucose, UA: NEGATIVE mg/dL
Hgb urine dipstick: NEGATIVE
KETONES UR: NEGATIVE mg/dL
Leukocytes, UA: NEGATIVE
Nitrite: NEGATIVE
PH: 6 (ref 5.0–8.0)
PROTEIN: 30 mg/dL — AB
SQUAMOUS EPITHELIAL / LPF: NONE SEEN
Specific Gravity, Urine: 1.017 (ref 1.005–1.030)

## 2016-04-29 LAB — COMPREHENSIVE METABOLIC PANEL
ALK PHOS: 94 U/L (ref 38–126)
ALT: 24 U/L (ref 17–63)
AST: 28 U/L (ref 15–41)
Albumin: 4.6 g/dL (ref 3.5–5.0)
Anion gap: 9 (ref 5–15)
BUN: 11 mg/dL (ref 6–20)
CALCIUM: 9.5 mg/dL (ref 8.9–10.3)
CHLORIDE: 105 mmol/L (ref 101–111)
CO2: 25 mmol/L (ref 22–32)
CREATININE: 1.17 mg/dL (ref 0.61–1.24)
GFR calc Af Amer: >60 mL/min (ref >60)
GFR calc non Af Amer: >60 mL/min (ref >60)
Glucose, Bld: 147 mg/dL — ABNORMAL HIGH (ref 65–99)
Potassium: 3.5 mmol/L (ref 3.5–5.1)
SODIUM: 139 mmol/L (ref 135–145)
Total Bilirubin: 0.4 mg/dL (ref 0.3–1.2)
Total Protein: 8.9 g/dL — ABNORMAL HIGH (ref 6.5–8.1)

## 2016-04-29 LAB — SALICYLATE LEVEL: Salicylate Lvl: <4 mg/dL (ref 2.8–30.0)

## 2016-04-29 LAB — ACETAMINOPHEN LEVEL: Acetaminophen (Tylenol), Serum: <10 ug/mL — ABNORMAL LOW (ref 10–30)

## 2016-04-29 LAB — ETHANOL: Alcohol, Ethyl (B): <5 mg/dL (ref <5)

## 2016-04-29 LAB — TROPONIN I

## 2016-04-29 MED ORDER — LORAZEPAM 2 MG/ML IJ SOLN
1.0000 mg | Freq: Once | INTRAMUSCULAR | Status: AC
  Administered 2016-04-29: 1 mg via INTRAVENOUS
  Filled 2016-04-29: qty 1

## 2016-04-29 MED ORDER — LEVETIRACETAM 500 MG PO TABS
500.0000 mg | ORAL_TABLET | Freq: Once | ORAL | Status: AC
  Administered 2016-04-29: 500 mg via ORAL
  Filled 2016-04-29: qty 1

## 2016-04-29 MED ORDER — LEVETIRACETAM 500 MG PO TABS: 500.0000 mg | ORAL_TABLET | Freq: Two times a day (BID) | ORAL | 2 refills | Status: DC

## 2016-04-29 NOTE — ED Provider Notes (Signed)
[facility] Emergency Department Provider Note   ____________________________________________   First MD Initiated Contact with Patient 04/29/16 1707     (approximate)  I have reviewed the triage vital signs and the nursing notes.   HISTORY  Chief Complaint Seizures and Fall    HPI Joseph [name] is a 35 y.o. male who reportedly was at home and had a seizure and fell or fell and had a seizure family is not here yet patient only remembers falling. Patient is currently saying nothing is bothering him he still having occasional twitches and jerks of the arms especially left arm. Patient will respond appropriately to most questions but occasionally seems to get losses O he didn't hear one of my questions. I'm Wondering if he still having some subclinical seizures.   Past Medical History:  Diagnosis Date  . Asthma   . Hypertension     Patient Active Problem List   Diagnosis Date Noted  . Seizure (HCC) 09/02/2015    Past Surgical History:  Procedure Laterality Date  . BACK SURGERY      Prior to Admission medications   Medication Sig Start Date End Date Taking? Authorizing Provider  gabapentin (NEURONTIN) 300 MG capsule Take 300-600 mg by mouth 2 (two) times daily.    Historical Provider, MD  levETIRAcetam (KEPPRA) 500 MG tablet Take 1 tablet (500 mg total) by mouth 2 (two) times daily. 04/29/16   Arnaldo Natal, MD  LISINOPRIL PO Take 1 tablet by mouth daily.    Historical Provider, MD  SUBOXONE 8-2 MG FILM Place 1 Film under the tongue 3 (three) times daily.    Historical Provider, MD  TRIAMTERENE-HCTZ PO Take 1 tablet by mouth daily. Patient states takes a HCTZ combo tablet that is green, this is only known tablet with that combination. Asked if metoprolol, losartan, bisoprolol, etc. Sounded familiar but patient only knew color and that HCTZ was in it.    Historical Provider, MD    Allergies Penicillins  Family History  Problem Relation Age of  Onset  . Hypertension Other     Social History Social History  Substance Use Topics  . Smoking status: Current Every Day Smoker    Packs/day: 0.50    Types: Cigarettes  . Smokeless tobacco: Not on file  . Alcohol use No    Review of Systems Constitutional: No fever/chills Eyes: No visual changes. ENT: No sore throat. Cardiovascular: Denies chest pain. Respiratory: Denies shortness of breath. Gastrointestinal: No abdominal pain.  No nausea, no vomiting.  No diarrhea.  No constipation. Genitourinary: Negative for dysuria. Musculoskeletal: Negative for back pain. Skin: Negative for rash. Neurological: Negative for headaches, focal weakness or numbness.  10-point ROS otherwise negative.  ____________________________________________   PHYSICAL EXAM:  VITAL SIGNS: ED Triage Vitals [04/29/16 1703]  Enc Vitals Group     BP      Pulse Rate (!) 122     Resp (!) 22     Temp 98.4 F (36.9 C)     Temp Source Oral     SpO2 94 %     Weight      Height      Head Circumference      Peak Flow      Pain Score      Pain Loc      Pain Edu?      Excl. in GC?     Constitutional: Alert and oriented. Well appearing and in no acute distress. Eyes: Conjunctivae are  normal. PERRL. EOMI. Head: Atraumatic. Nose: No congestion/rhinnorhea. Mouth/Throat: Mucous membranes are moist.  Oropharynx non-erythematous. Neck: No stridor.  Cardiovascular: Normal rate, regular rhythm. Grossly normal heart sounds.  Good peripheral circulation. Respiratory: Normal respiratory effort.  No retractions. Lungs CTAB. Gastrointestinal: Soft and nontender. No distention. No abdominal bruits. No CVA tenderness. Musculoskeletal: No lower extremity tenderness nor edema.  No joint effusions. Neurologic:  Normal speech and language. No gross focal neurologic deficits are appreciated. Radial nerves II through XII are intact cerebellar finger-nose rapid alternating movements are normal in the hands motor strength  is 5 over 5 throughout sensation is normal. Please see the history of present illness for the net description of his alertness. No gait instability. Skin:  Skin is warm, dry and intact. No rash noted. Psychiatric: Mood and affect are normal. Speech and behavior are normal.  ____________________________________________   LABS (all labs ordered are listed, but only abnormal results are displayed)  Labs Reviewed  COMPREHENSIVE METABOLIC PANEL - Abnormal; Notable for the following:       Result Value   Glucose, Bld 147 (*)    Total Protein 8.9 (*)    All other components within normal limits  ACETAMINOPHEN LEVEL - Abnormal; Notable for the following:    Acetaminophen (Tylenol), Serum <10 (*)    All other components within normal limits  LACTIC ACID, PLASMA - Abnormal; Notable for the following:    Lactic Acid, Venous 2.1 (*)    All other components within normal limits  CBC WITH DIFFERENTIAL/PLATELET - Abnormal; Notable for the following:    WBC 17.7 (*)    RDW 14.9 (*)    Neutro Abs 14.9 (*)    All other components within normal limits  URINALYSIS COMPLETEWITH MICROSCOPIC ([facility] ONLY) - Abnormal; Notable for the following:    Color, Urine STRAW (*)    APPearance CLEAR (*)    Protein, ur 30 (*)    Bacteria, UA RARE (*)    All other components within normal limits  SALICYLATE LEVEL  ETHANOL  URINE DRUG SCREEN, QUALITATIVE ([facility] ONLY)  TROPONIN I  LACTIC ACID, PLASMA   ____________________________________________  EKG  KG read and interpreted by me shows sinus tachycardia rate 110 normal axis baseline wander and nonspecific ST-T wave changes is also a prolonged QRS duration 108 ms. ____________________________________________  RADIOLOGY CLINICAL DATA:  Seizure with fall  EXAM: CT HEAD WITHOUT CONTRAST  TECHNIQUE: Contiguous axial images were obtained from the base of the skull through the vertex without intravenous contrast.  COMPARISON:   None.  FINDINGS: Brain: The ventricles are normal in size and configuration. There is no intracranial mass, hemorrhage, extra-axial fluid collection, or midline shift. Gray-white compartments appear normal. No acute infarct evident.  Vascular: No hyperdense vessel evident. No vascular calcifications apparent.  Skull: The bony calvarium appears intact.  Sinuses/Orbits: There is mucosal thickening in several ethmoid air cells bilaterally. Visualized paranasal sinuses otherwise are clear. Visualized orbits appear symmetric bilaterally.  Other: Visualized mastoid air cells are clear.  IMPRESSION: There is ethmoid sinus disease bilaterally. No intracranial mass, hemorrhage, or focal gray - white compartment lesions/ acute appearing infarct.   Electronically Signed   By: Bretta Bang III M.D.   On: 04/29/2016 17:41  _____Patient does not have any ethmoid pain or tenderness. _______________________________________   PROCEDURES  Procedure(s) performed:   Procedures  Critical Care performed:   ____________________________________________   INITIAL IMPRESSION / ASSESSMENT AND PLAN / ED COURSE  Pertinent labs & imaging results that were available  during my care of the patient were reviewed by me and considered in my medical decision making (see chart for details).    Clinical Course   Stressed patient withDr Lonia MadZylikman the neurologist on-call. He recommended starting Keppra 500 twice a day. Follow-up palpation.  ____________________________________________   FINAL CLINICAL IMPRESSION(S) / ED DIAGNOSES  Final diagnoses:  Seizures (HCC)      NEW MEDICATIONS STARTED DURING THIS VISIT:  Discharge Medication List as of 04/29/2016  7:51 PM    START taking these medications   Details  levETIRAcetam (KEPPRA) 500 MG tablet Take 1 tablet (500 mg total) by mouth 2 (two) times daily., Starting Sun 04/29/2016, Print         Note:  This document was  prepared using Dragon voice recognition software and may include unintentional dictation errors.    Arnaldo NatalPaul F Kynleigh Artz, MD 04/29/16 [phone]

## 2016-04-29 NOTE — Discharge Instructions (Signed)
Please take the Keppra 1 pill twice a day. Please do not drive until neurology telemetry to you can. Please call either Dr. Sherryll BurgerShah or Dr. Malvin JohnsPotter, they work together, they're the neurologists in town. Let them know you had a seizure and was seen in the ER and started on medication as instructed by the hospital neurologist Dr. Lonia MadZylikman. Also for now be careful you do not do any hazardous activities like climbing on ladders using chainsaws or taking baths. He can take showers but do not take a bath or go swimming people have drowned if they have a seizure in the bathtub.

## 2016-04-29 NOTE — ED Notes (Signed)
MD aware of BP

## 2016-04-29 NOTE — ED Notes (Signed)
MD aware of lactic acid; no new orders

## 2016-04-29 NOTE — ED Triage Notes (Signed)
Pt presents to ED from home via EMS for unwitnessed fall related to a seizure. Pt states he got up to use the bathroom and immediately hit the floor; per ems  " pt states they found him with tremors seizing". Pt bit right side of lip , scant blood on arrival. Alert and oriented x4

## 2016-09-08 ENCOUNTER — Encounter: Payer: Self-pay | Admitting: *Deleted

## 2016-09-08 ENCOUNTER — Emergency Department
Admission: EM | Admit: 2016-09-08 | Discharge: 2016-09-08 | Discharge status: Against Medical Advice | Payer: Medicaid Other | Attending: Emergency Medicine | Admitting: Emergency Medicine

## 2016-09-08 DIAGNOSIS — F17210 Nicotine dependence, cigarettes, uncomplicated: Secondary | ICD-10-CM | POA: Insufficient documentation

## 2016-09-08 DIAGNOSIS — T401X1A Poisoning by heroin, accidental (unintentional), initial encounter: Secondary | ICD-10-CM | POA: Diagnosis present

## 2016-09-08 DIAGNOSIS — J45909 Unspecified asthma, uncomplicated: Secondary | ICD-10-CM | POA: Insufficient documentation

## 2016-09-08 DIAGNOSIS — I10 Essential (primary) hypertension: Secondary | ICD-10-CM | POA: Diagnosis not present

## 2016-09-08 HISTORY — DX: Other psychoactive substance abuse, uncomplicated: F19.10

## 2016-09-08 NOTE — ED Notes (Signed)
Patient has taken off O2 via [place].

## 2016-09-08 NOTE — ED Notes (Signed)
Patient's father came back to see the patient and patient left AMA. Patient declined to come back to sign out AMA or speak with the physician.

## 2016-09-08 NOTE — ED Notes (Signed)
Patient placed on 2L O2 via [place]. Sats at 94% on 2L.

## 2016-09-08 NOTE — ED Provider Notes (Signed)
[facility] Emergency Department Provider Note   ____________________________________________   I have reviewed the triage vital signs and the nursing notes.   HISTORY  Chief Complaint Drug Overdose   History limited by: Not Limited   HPI Joseph [name] is a 36 y.o. male who presents to the emergency department today because of concern for heroin overdose. The patient states that he snorted heroin for the first time in about 5 years today. He states that he does take Suboxone and occasionally will use Percocets. EMS had been to the patient's house already once today for unresponsiveness. They gave him 4 mg of Narcan at that time however he refused transport to the hospital. A few hours later he became unresponsive again. EMS gave him 2 mg of Narcan and he did wake up. Patient is unsure if the heroin was cut with any other substance.   Past Medical History:  Diagnosis Date  . Asthma   . Hypertension     Patient Active Problem List   Diagnosis Date Noted  . Seizure (HCC) 09/02/2015    Past Surgical History:  Procedure Laterality Date  . BACK SURGERY      Prior to Admission medications   Medication Sig Start Date End Date Taking? Authorizing Provider  gabapentin (NEURONTIN) 300 MG capsule Take 300-600 mg by mouth 2 (two) times daily.    Historical Provider, MD  levETIRAcetam (KEPPRA) 500 MG tablet Take 1 tablet (500 mg total) by mouth 2 (two) times daily. 04/29/16   Arnaldo Natal, MD  LISINOPRIL PO Take 1 tablet by mouth daily.    Historical Provider, MD  SUBOXONE 8-2 MG FILM Place 1 Film under the tongue 3 (three) times daily.    Historical Provider, MD  TRIAMTERENE-HCTZ PO Take 1 tablet by mouth daily. Patient states takes a HCTZ combo tablet that is green, this is only known tablet with that combination. Asked if metoprolol, losartan, bisoprolol, etc. Sounded familiar but patient only knew color and that HCTZ was in it.    Historical Provider, MD     Allergies Penicillins  Family History  Problem Relation Age of Onset  . Hypertension Other     Social History Social History  Substance Use Topics  . Smoking status: Current Every Day Smoker    Packs/day: 0.50    Types: Cigarettes  . Smokeless tobacco: Not on file  . Alcohol use No    Review of Systems  Constitutional: Negative for fever. Cardiovascular: Negative for chest pain. Respiratory: Negative for shortness of breath. Gastrointestinal: Negative for abdominal pain, vomiting and diarrhea. Neurological: Negative for headaches, focal weakness or numbness.  10-point ROS otherwise negative.  ____________________________________________   PHYSICAL EXAM:  VITAL SIGNS: ED Triage Vitals  Enc Vitals Group     BP 112/80     Pulse 99     Resp 18     Temp 97.6     Temp src      SpO2 97   Constitutional: Alert and oriented. Well appearing and in no distress. Eyes: Conjunctivae are normal. Normal extraocular movements. ENT   Head: Normocephalic and atraumatic.   Nose: No congestion/rhinnorhea.   Mouth/Throat: Mucous membranes are moist.   Neck: No stridor. Hematological/Lymphatic/Immunilogical: No cervical lymphadenopathy. Cardiovascular: Normal rate, regular rhythm.  No murmurs, rubs, or gallops.  Respiratory: Normal respiratory effort without tachypnea nor retractions. Breath sounds are clear and equal bilaterally. No wheezes/rales/rhonchi. Gastrointestinal: Soft and non tender. No rebound. No guarding.  Genitourinary: Deferred Musculoskeletal: Normal  range of motion in all extremities. No lower extremity edema. Neurologic:  Normal speech and language. No gross focal neurologic deficits are appreciated.  Skin:  Skin is warm, dry and intact. No rash noted. Psychiatric: Mood and affect are normal. Speech and behavior are normal. Patient exhibits appropriate insight and judgment.  ____________________________________________    LABS (pertinent  positives/negatives)  None  ____________________________________________   EKG  None  ____________________________________________    RADIOLOGY  None  ____________________________________________   PROCEDURES  Procedures  ____________________________________________   INITIAL IMPRESSION / ASSESSMENT AND PLAN / ED COURSE  Pertinent labs & imaging results that were available during my care of the patient were reviewed by me and considered in my medical decision making (see chart for details).  Patient presented to the emergency department via EMS because of concerns for opiate overdose status post Narcan. Patient is not weak. Will plan on observing patient for a number of hours in the emergency department.  ----------------------------------------- 9:44 PM on 09/08/2016 -----------------------------------------  Informed by nursing staff that patient has eloped.   ____________________________________________   FINAL CLINICAL IMPRESSION(S) / ED DIAGNOSES  Final diagnoses:  Accidental overdose of heroin, initial encounter     Note: This dictation was prepared with Dragon dictation. Any transcriptional errors that result from this process are unintentional     Phineas SemenGraydon Marnita Poirier, MD 09/08/16 2144

## 2016-09-08 NOTE — ED Triage Notes (Signed)
Per EMS report, patient was found unresponsive 2 hrs ago, was given 4mg  Narcan intranasally, recovered and refused transport. Patient had episode of vomiting.Patient was found unresponsive again 45 minutes ago, respiratory arrest, pinpoint pupils and was given 2mg  Narcan intranasally and recovered and agreed to come in to the hospital. Patient was found by his 10-year daughter during one of these episodes. Patient states he sniffed heroin which is something he hasn't done for 3-4 years. Patient states he usually takes pills.

## 2016-09-08 NOTE — ED Notes (Signed)
Patient is standing at side of the stretcher. Patient requested and received a urinal. NAD. Patient is calm and cooperative.

## 2016-09-09 ENCOUNTER — Emergency Department
Admission: EM | Admit: 2016-09-09 | Discharge: 2016-09-09 | Disposition: A | Discharge status: Home / Self Care | Payer: Medicaid Other | Attending: Emergency Medicine | Admitting: Emergency Medicine

## 2016-09-09 ENCOUNTER — Encounter: Payer: Self-pay | Admitting: Emergency Medicine

## 2016-09-09 DIAGNOSIS — F17210 Nicotine dependence, cigarettes, uncomplicated: Secondary | ICD-10-CM | POA: Insufficient documentation

## 2016-09-09 DIAGNOSIS — Z79899 Other long term (current) drug therapy: Secondary | ICD-10-CM | POA: Diagnosis not present

## 2016-09-09 DIAGNOSIS — J45909 Unspecified asthma, uncomplicated: Secondary | ICD-10-CM | POA: Insufficient documentation

## 2016-09-09 DIAGNOSIS — I10 Essential (primary) hypertension: Secondary | ICD-10-CM | POA: Insufficient documentation

## 2016-09-09 DIAGNOSIS — R258 Other abnormal involuntary movements: Secondary | ICD-10-CM | POA: Diagnosis not present

## 2016-09-09 DIAGNOSIS — Z046 Encounter for general psychiatric examination, requested by authority: Secondary | ICD-10-CM

## 2016-09-09 DIAGNOSIS — T401X1A Poisoning by heroin, accidental (unintentional), initial encounter: Secondary | ICD-10-CM | POA: Insufficient documentation

## 2016-09-09 LAB — SALICYLATE LEVEL

## 2016-09-09 LAB — COMPREHENSIVE METABOLIC PANEL
ALT: 84 U/L — ABNORMAL HIGH (ref 17–63)
AST: 80 U/L — AB (ref 15–41)
Albumin: 4.4 g/dL (ref 3.5–5.0)
Alkaline Phosphatase: 96 U/L (ref 38–126)
Anion gap: 14 (ref 5–15)
BUN: 17 mg/dL (ref 6–20)
CHLORIDE: 101 mmol/L (ref 101–111)
CO2: 25 mmol/L (ref 22–32)
Calcium: 8.5 mg/dL — ABNORMAL LOW (ref 8.9–10.3)
Creatinine, Ser: 1.24 mg/dL (ref 0.61–1.24)
Glucose, Bld: 232 mg/dL — ABNORMAL HIGH (ref 65–99)
POTASSIUM: 3.4 mmol/L — AB (ref 3.5–5.1)
Sodium: 140 mmol/L (ref 135–145)
Total Bilirubin: 0.6 mg/dL (ref 0.3–1.2)
Total Protein: 8.3 g/dL — ABNORMAL HIGH (ref 6.5–8.1)

## 2016-09-09 LAB — URINE DRUG SCREEN, QUALITATIVE (ARMC ONLY)
AMPHETAMINES, UR SCREEN: NOT DETECTED
Barbiturates, Ur Screen: NOT DETECTED
Benzodiazepine, Ur Scrn: NOT DETECTED
Cannabinoid 50 Ng, Ur ~~LOC~~: NOT DETECTED
Cocaine Metabolite,Ur ~~LOC~~: POSITIVE — AB
MDMA (ECSTASY) UR SCREEN: NOT DETECTED
Methadone Scn, Ur: NOT DETECTED
Opiate, Ur Screen: NOT DETECTED
Phencyclidine (PCP) Ur S: NOT DETECTED
TRICYCLIC, UR SCREEN: NOT DETECTED

## 2016-09-09 LAB — CBC
HCT: 45 % (ref 40.0–52.0)
Hemoglobin: 14.3 g/dL (ref 13.0–18.0)
MCH: 26.6 pg (ref 26.0–34.0)
MCHC: 31.7 g/dL — ABNORMAL LOW (ref 32.0–36.0)
MCV: 84 fL (ref 80.0–100.0)
PLATELETS: 407 10*3/uL (ref 150–440)
RBC: 5.36 MIL/uL (ref 4.40–5.90)
RDW: 14.9 % — ABNORMAL HIGH (ref 11.5–14.5)
WBC: 12.8 10*3/uL — AB (ref 3.8–10.6)

## 2016-09-09 LAB — ACETAMINOPHEN LEVEL: Acetaminophen (Tylenol), Serum: <10 ug/mL — ABNORMAL LOW (ref 10–30)

## 2016-09-09 LAB — ETHANOL

## 2016-09-09 MED ORDER — ACETAMINOPHEN 500 MG PO TABS
1000.0000 mg | ORAL_TABLET | Freq: Once | ORAL | Status: AC
  Administered 2016-09-09: 1000 mg via ORAL
  Filled 2016-09-09: qty 2

## 2016-09-09 MED ORDER — NALOXONE HCL 4 MG/0.1ML NA LIQD
1.0000 | Freq: Once | NASAL | Status: AC
  Administered 2016-09-09: 1 via NASAL
  Filled 2016-09-09: qty 4

## 2016-09-09 MED ORDER — NALOXONE HCL 4 MG/0.1ML NA LIQD
NASAL | Status: AC
  Filled 2016-09-09: qty 4

## 2016-09-09 NOTE — ED Notes (Signed)
Requested tray from dining services.

## 2016-09-09 NOTE — ED Provider Notes (Signed)
Patient has been seen and cleared for discharge by psychiatry. He denies any suicidal intent.  Patient will be provided naloxone nasal spray, as he is at high risk for using again. Presently he is agreeable to following up with outpatient substance abuse program, and we have given him follow-up information for residential treatment services.  He is awake and alert, in no distress and agreeable with the plan not to reuse and understands the risk of overdose and the potential for death while using heroin. He appears clinically stable, and appropriate for discharge with follow-up with residential treatment services.  Patient's involuntary commitment has been rescinded by psychiatry, Dr. Jonelle SidleBreuer whom I discussed the case with.    Sharyn CreamerMark Quale, MD 09/09/16 1230

## 2016-09-09 NOTE — ED Triage Notes (Signed)
Pt to rm 12 via EMS from home, report heroin overdose x 3 day, this most recent episode given 4 narcan intranasally.  Pt reports HA at this time.  Pt denies SI.  Reports he does want help for substance use.

## 2016-09-09 NOTE — ED Notes (Signed)
TTS at bedside. 

## 2016-09-09 NOTE — ED Notes (Signed)
BEHAVIORAL HEALTH ROUNDING  Patient sleeping: No.  Patient alert and oriented: yes  Behavior appropriate: Yes. ; If no, describe:  Nutrition and fluids offered: Yes  Toileting and hygiene offered: Yes  Sitter present: not applicable, Q 15 min safety rounds and observation.  Law enforcement present: Yes ODS  

## 2016-09-09 NOTE — BH Assessment (Signed)
Tele Assessment Note   Joseph [name] is an 36 y.o. male presented to [facility] multiple times with the last 24 hrs after an overdose of drugs. Patient acknowledged he was attempting to get high. He took what he thought was heroin but UDS shows patient is only positive for cocaine.  Patient denies SI, HI  Or A/V. Patient is currently on a suboxone maintenance program. History of polysubstance dependence.  SOC cleared patient for discharge. Patient did not meet any crisis criteria.   Diagnosis: Cocaine use disorder; Opiate use disorder  Past Medical History:  Past Medical History:  Diagnosis Date  . Asthma   . Drug abuse   . Hypertension     Past Surgical History:  Procedure Laterality Date  . APPENDECTOMY    . BACK SURGERY    . CHOLECYSTECTOMY      Family History:  Family History  Problem Relation Age of Onset  . Hypertension Other     Social History:  reports that he has been smoking Cigarettes.  He has been smoking about 0.50 packs per day. He has never used smokeless tobacco. He reports that he uses drugs. He reports that he does not drink alcohol.  Additional Social History:  Alcohol / Drug Use Pain Medications: see MAR Prescriptions: see MAR Over the Counter: see MAR  CIWA: CIWA-Ar BP: 137/77 Pulse Rate: 97 COWS: Clinical Opiate Withdrawal Scale (COWS) Resting Pulse Rate: Pulse Rate 101-120 Sweating: No report of chills or flushing Restlessness: Able to sit still Pupil Size: Pupils possibly larger than normal for room light Bone or Joint Aches: Not present Runny Nose or Tearing: Not present GI Upset: No GI symptoms Tremor: No tremor Yawning: No yawning Anxiety or Irritability: None Gooseflesh Skin: Skin is smooth COWS Total Score: 3  PATIENT STRENGTHS: (choose at least two) Average or above average intelligence General fund of knowledge  Allergies:  Allergies  Allergen Reactions  . Penicillins Rash    Has patient had a PCN reaction causing  immediate rash, facial/tongue/throat swelling, SOB or lightheadedness with hypotension: Yes Has patient had a PCN reaction causing severe rash involving mucus membranes or skin necrosis: No Has patient had a PCN reaction that required hospitalization No Has patient had a PCN reaction occurring within the last 10 years: No If all of the above answers are "NO", then may proceed with Cephalosporin use.    Home Medications:  (Not in a hospital admission)  OB/GYN Status:  No LMP for male patient.  General Assessment Data Location of Assessment: [facility] ED TTS Assessment: In system Is this a Tele or Face-to-Face Assessment?: Face-to-Face Is this an Initial Assessment or a Re-assessment for this encounter?: Initial Assessment Marital status: Divorced Is patient pregnant?: No Pregnancy Status: No Living Arrangements: Parent (with his father) Can pt return to current living arrangement?: Yes Admission Status: Involuntary Is patient capable of signing voluntary admission?: Yes Referral Source: Other (EMS) Insurance type: MCD  Medical Screening Exam [facility] Walk-in ONLY) Medical Exam completed: Yes  Crisis Care Plan Living Arrangements: Parent (with his father) Name of Psychiatrist: n/a Name of Therapist: n/a  Education Status Is patient currently in school?: No  Risk to self with the past 6 months Suicidal Ideation: No Has patient been a risk to self within the past 6 months prior to admission? : No Suicidal Intent: No Has patient had any suicidal intent within the past 6 months prior to admission? : No Is patient at risk for suicide?: No Suicidal Plan?: No Has  patient had any suicidal plan within the past 6 months prior to admission? : No Access to Means: No What has been your use of drugs/alcohol within the last 12 months?: states he was using heroin Previous Attempts/Gestures: No How many times?: 0 Other Self Harm Risks: 0 Intentional Self Injurious Behavior: None Family  Suicide History: No Persecutory voices/beliefs?: No Depression: No Substance abuse history and/or treatment for substance abuse?: Yes Suicide prevention information given to non-admitted patients: Not applicable  Risk to Others within the past 6 months Homicidal Ideation: No Does patient have any lifetime risk of violence toward others beyond the six months prior to admission? : No Thoughts of Harm to Others: No Current Homicidal Intent: No Current Homicidal Plan: No Access to Homicidal Means: No History of harm to others?: No Assessment of Violence: None Noted Does patient have access to weapons?: No Criminal Charges Pending?: No Does patient have a court date: No Is patient on probation?: No  Psychosis Hallucinations: None noted Delusions: None noted  Mental Status Report Appearance/Hygiene: Unremarkable Eye Contact: Good Motor Activity: Freedom of movement Speech: Logical/coherent Level of Consciousness: Alert Mood: Pleasant, Euthymic Affect: Appropriate to circumstance Anxiety Level: None Thought Processes: Coherent, Relevant Judgement: Unimpaired Orientation: Person, Place, Time, Situation Obsessive Compulsive Thoughts/Behaviors: None  Cognitive Functioning Concentration: Normal Memory: Recent Intact, Remote Intact IQ: Average Insight: Fair Impulse Control: Poor Appetite: Good Sleep: Decreased  ADLScreening ([facility] Assessment Services) Patient's cognitive ability adequate to safely complete daily activities?: Yes Patient able to express need for assistance with ADLs?: Yes Independently performs ADLs?: Yes (appropriate for developmental age)  Prior Inpatient Therapy Prior Inpatient Therapy: No  Prior Outpatient Therapy Prior Outpatient Therapy: No Does patient have an ACCT team?: No Does patient have Intensive In-House Services?  : No Does patient have [facility] services? : No Does patient have P4CC services?: No  ADL Screening (condition at time of  admission) Patient's cognitive ability adequate to safely complete daily activities?: Yes Is the patient deaf or have difficulty hearing?: No Does the patient have difficulty seeing, even when wearing glasses/contacts?: No Does the patient have difficulty concentrating, remembering, or making decisions?: No Patient able to express need for assistance with ADLs?: Yes Does the patient have difficulty dressing or bathing?: No Independently performs ADLs?: Yes (appropriate for developmental age)       Abuse/Neglect Assessment (Assessment to be complete while patient is alone) Physical Abuse: Denies Verbal Abuse: Denies Sexual Abuse: Denies     Merchant navy officerAdvance Directives (For Healthcare) Does Patient Have a Medical Advance Directive?: No Would patient like information on creating a medical advance directive?: No - Patient declined    Additional Information 1:1 In Past 12 Months?: No CIRT Risk: No Elopement Risk: No Does patient have medical clearance?: Yes     Disposition:  Disposition Initial Assessment Completed for this Encounter: Yes Disposition of Patient: Outpatient treatment (SOC recommends discharge) Type of outpatient treatment: Chemical Dependence - Intensive Outpatient  Vonzell Schlattershley [facility] 09/09/2016 12:55 PM

## 2016-09-09 NOTE — ED Provider Notes (Signed)
[facility] Emergency Department Provider Note  ____________________________________________   First MD Initiated Contact with Patient 09/09/16 0301     (approximate)  I have reviewed the triage vital signs and the nursing notes.   HISTORY  Chief Complaint Drug Overdose    HPI Joseph [name] is a 36 y.o. male who presents by EMS after a heroin overdose.  This is the third time EMS has been called out to his house today.  During the last ED visit just a few hours ago he eloped.  He was not under involuntary commitment at the time.  This time the paramedics told me that he required 4 mg of intranasal Narcan and that he shot up with hairline in front of his 2 children ages 39 and 68.  Is currently alert and oriented and "feeling like an ass."  He denies suicidal ideation.  He states that he used to abuse drugs and has been doing well on Suboxone and has not abused narcotics for years.  He "came in contact with some heroin" today and thinks he just underestimated how strong he was.  I pointed out that he overdosed not once but 3 times today and he just said "yeah, I thought I could handle it."  He denies the use of any other drugs.  He states that his head hurts right now but he has no other symptoms as per the review of systems below.  The onset of his unresponsiveness and depressed respirations was acute and severe.  Narcan made it better.   Past Medical History:  Diagnosis Date  . Asthma   . Drug abuse   . Hypertension     Patient Active Problem List   Diagnosis Date Noted  . Seizure (HCC) 09/02/2015    Past Surgical History:  Procedure Laterality Date  . APPENDECTOMY    . BACK SURGERY    . CHOLECYSTECTOMY      Prior to Admission medications   Medication Sig Start Date End Date Taking? Authorizing Provider  Aspirin-Salicylamide-Caffeine (BC HEADACHE POWDER PO) Take 1 packet by mouth daily as needed.   Yes Historical Provider, MD  gabapentin  (NEURONTIN) 300 MG capsule Take 300-600 mg by mouth 3 (three) times daily. Two caps qam, 2 caps in the afternoon, and 1 qhs   Yes Historical Provider, MD  lisinopril (PRINIVIL,ZESTRIL) 20 MG tablet Take 1 tablet by mouth daily.   Yes Historical Provider, MD  SUBOXONE 8-2 MG FILM Place 1 Film under the tongue 3 (three) times daily.   Yes Historical Provider, MD  triamterene-hydrochlorothiazide (MAXZIDE-25) 37.5-25 MG tablet Take 1 tablet by mouth daily.    Yes Historical Provider, MD  levETIRAcetam (KEPPRA) 500 MG tablet Take 1 tablet (500 mg total) by mouth 2 (two) times daily. Patient not taking: Reported on 09/08/2016 04/29/16   Arnaldo Natal, MD    Allergies Penicillins  Family History  Problem Relation Age of Onset  . Hypertension Other     Social History Social History  Substance Use Topics  . Smoking status: Current Every Day Smoker    Packs/day: 0.50    Types: Cigarettes  . Smokeless tobacco: Never Used  . Alcohol use No    Review of Systems Constitutional: No fever/chills Eyes: No visual changes. ENT: No sore throat. Cardiovascular: Denies chest pain. Respiratory: Denies shortness of breath. Gastrointestinal: No abdominal pain.  No nausea, no vomiting.  No diarrhea.  No constipation. Genitourinary: Negative for dysuria. Musculoskeletal: Negative for back pain. Skin:  Negative for rash. Neurological: +headache.  No focal weakness or numbness.  10-point ROS otherwise negative.  ____________________________________________   PHYSICAL EXAM:  VITAL SIGNS: ED Triage Vitals  Enc Vitals Group     BP 09/09/16 0242 123/74     Pulse Rate 09/09/16 0242 (!) 102     Resp 09/09/16 0242 14     Temp 09/09/16 0242 98.7 F (37.1 C)     Temp Source 09/09/16 0242 Oral     SpO2 09/09/16 0242 97 %     Weight 09/09/16 0243 (!) 350 lb (158.8 kg)     Height 09/09/16 0243 6\' 2"  (1.88 m)     Head Circumference --      Peak Flow --      Pain Score 09/09/16 0243 8     Pain Loc --        Pain Edu? --      Excl. in GC? --     Constitutional: Alert and oriented. Well appearing and in no acute distress. Eyes: Conjunctivae are normal. PERRL. EOMI. Head: Atraumatic. Nose: No congestion/rhinnorhea. Mouth/Throat: Mucous membranes are moist.  Oropharynx non-erythematous. Neck: No stridor.  No meningeal signs.   Cardiovascular: Normal rate, regular rhythm. Good peripheral circulation. Grossly normal heart sounds. Respiratory: Normal respiratory effort.  No retractions. Lungs CTAB. Gastrointestinal: Soft and nontender. No distention.  Musculoskeletal: No lower extremity tenderness nor edema. No gross deformities of extremities. Neurologic:  Normal speech and language. No gross focal neurologic deficits are appreciated.  Skin:  Skin is warm, dry and intact Except for multiple puncture wounds in his right [facility] Psychiatric: Mood and affect are normal. Speech and behavior are normal.  Denies SI/HI but has no insight or judgment into his situation  ____________________________________________   LABS (all labs ordered are listed, but only abnormal results are displayed)  Labs Reviewed  COMPREHENSIVE METABOLIC PANEL - Abnormal; Notable for the following:       Result Value   Potassium 3.4 (*)    Glucose, Bld 232 (*)    Calcium 8.5 (*)    Total Protein 8.3 (*)    AST 80 (*)    ALT 84 (*)    All other components within normal limits  ACETAMINOPHEN LEVEL - Abnormal; Notable for the following:    Acetaminophen (Tylenol), Serum <10 (*)    All other components within normal limits  CBC - Abnormal; Notable for the following:    WBC 12.8 (*)    MCHC 31.7 (*)    RDW 14.9 (*)    All other components within normal limits  URINE DRUG SCREEN, QUALITATIVE ([facility] ONLY) - Abnormal; Notable for the following:    Cocaine Metabolite,Ur [place] POSITIVE (*)    All other components within normal limits  ETHANOL  SALICYLATE LEVEL   ____________________________________________  EKG  ED ECG  REPORT I, Rubbie Goostree, the attending physician, personally viewed and interpreted this ECG.  Date: 09/09/2016 EKG Time: 2:49 AM Rate: 102 Rhythm: Borderline sinus tachycardia QRS Axis: normal Intervals: Slightly prolonged QTc at 489 ms ST/T Wave abnormalities: normal Conduction Disturbances: none Narrative Interpretation: unremarkable  ____________________________________________  RADIOLOGY   No results found.  ____________________________________________   PROCEDURES  Procedure(s) performed:   Procedures   Critical Care performed: No ____________________________________________   INITIAL IMPRESSION / ASSESSMENT AND PLAN / ED COURSE  Pertinent labs & imaging results that were available during my care of the patient were reviewed by me and considered in my medical decision making (see chart for details).  The Sheriff's Department took out involuntary commitment papers against the patient given 3 separate incidents of overdose today.  I support this decision and think that he needs psychiatric evaluation.  Because he eloped during his last ED visit we are keeping a close eye on him and moving him over to the A side of the department where he can be closely observed.  I removed his peripheral IV given the risk of elopement but we will keep him on the monitor and continuous pulse oximetry in case the Narcan wears off.   Clinical Course as of Sep 09 798  Wynelle Link Sep 09, 2016  1610 Found out we do not have psych in person today.  Switched order to telepsych consult, ordered TTS consult, and informed secretary about the need for telepsych  [CF]    Clinical Course User Index [CF] Loleta Rose, MD    ____________________________________________  FINAL CLINICAL IMPRESSION(S) / ED DIAGNOSES  Final diagnoses:  Accidental heroin overdose, initial encounter  Involuntary commitment     MEDICATIONS GIVEN DURING THIS VISIT:  Medications  acetaminophen (TYLENOL) tablet  1,000 mg (1,000 mg Oral Given 09/09/16 0544)     NEW OUTPATIENT MEDICATIONS STARTED DURING THIS VISIT:  New Prescriptions   No medications on file    Modified Medications   No medications on file    Discontinued Medications   No medications on file     Note:  This document was prepared using Dragon voice recognition software and may include unintentional dictation errors.    Loleta Rose, MD 09/09/16 0800

## 2016-09-09 NOTE — ED Notes (Signed)
Pt. Refused shower

## 2016-09-09 NOTE — Progress Notes (Signed)
LCSW and TTS consulted. LCSW will do a CSW assessment and potentially CPS may be called ( Protection concerns for 15 and 9 years who witnessed him injecting heroin).   Delta Air LinesClaudine Neri Samek LCSW [phone]

## 2016-09-09 NOTE — ED Notes (Signed)

## 2016-09-09 NOTE — ED Notes (Signed)
Pt updated on plan of care

## 2016-09-09 NOTE — BH Assessment (Signed)
This clinician spoke with Wanda PlumpShona, CPS worker. Gave report regarding concerns about children in the home and father's drug use.

## 2016-09-09 NOTE — ED Notes (Signed)
Pt eating breakfast tray 

## 2016-09-09 NOTE — ED Notes (Signed)
Pt doing SOC at this time.  

## 2016-09-09 NOTE — ED Notes (Addendum)
Pt given both bags of belongings. Pt state that he is missing his shirt. No shirt in either bags. Pt provided gown to wear home due to not having 3XL blue scrub shirt. Pt unable to fit into 2XL shirt, he attempted to do so. Friend on the way for discharge.

## 2016-09-09 NOTE — ED Notes (Signed)
Pt updated on plan of care. States that he has narcan spray at home and knows how to use.
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
## 2016-09-09 NOTE — Discharge Instructions (Signed)

## 2016-11-05 ENCOUNTER — Emergency Department
Admission: EM | Admit: 2016-11-05 | Discharge: 2016-11-05 | Disposition: A | Discharge status: Home / Self Care | Payer: Medicaid Other | Attending: Emergency Medicine | Admitting: Emergency Medicine

## 2016-11-05 ENCOUNTER — Encounter: Payer: Self-pay | Admitting: Emergency Medicine

## 2016-11-05 DIAGNOSIS — J45909 Unspecified asthma, uncomplicated: Secondary | ICD-10-CM | POA: Insufficient documentation

## 2016-11-05 DIAGNOSIS — F17210 Nicotine dependence, cigarettes, uncomplicated: Secondary | ICD-10-CM | POA: Diagnosis not present

## 2016-11-05 DIAGNOSIS — F1910 Other psychoactive substance abuse, uncomplicated: Secondary | ICD-10-CM | POA: Insufficient documentation

## 2016-11-05 DIAGNOSIS — I10 Essential (primary) hypertension: Secondary | ICD-10-CM | POA: Insufficient documentation

## 2016-11-05 DIAGNOSIS — T40601A Poisoning by unspecified narcotics, accidental (unintentional), initial encounter: Secondary | ICD-10-CM

## 2016-11-05 DIAGNOSIS — Z7982 Long term (current) use of aspirin: Secondary | ICD-10-CM | POA: Diagnosis not present

## 2016-11-05 DIAGNOSIS — Z79899 Other long term (current) drug therapy: Secondary | ICD-10-CM | POA: Diagnosis not present

## 2016-11-05 DIAGNOSIS — F1920 Other psychoactive substance dependence, uncomplicated: Secondary | ICD-10-CM

## 2016-11-05 DIAGNOSIS — T402X1A Poisoning by other opioids, accidental (unintentional), initial encounter: Secondary | ICD-10-CM | POA: Diagnosis not present

## 2016-11-05 LAB — URINE DRUG SCREEN, QUALITATIVE (ARMC ONLY)
Amphetamines, Ur Screen: POSITIVE — AB
BARBITURATES, UR SCREEN: NOT DETECTED
BENZODIAZEPINE, UR SCRN: NOT DETECTED
Cannabinoid 50 Ng, Ur ~~LOC~~: NOT DETECTED
Cocaine Metabolite,Ur ~~LOC~~: POSITIVE — AB
MDMA (Ecstasy)Ur Screen: NOT DETECTED
METHADONE SCREEN, URINE: NOT DETECTED
OPIATE, UR SCREEN: POSITIVE — AB
Phencyclidine (PCP) Ur S: NOT DETECTED
TRICYCLIC, UR SCREEN: NOT DETECTED

## 2016-11-05 LAB — COMPREHENSIVE METABOLIC PANEL
ALBUMIN: 4.2 g/dL (ref 3.5–5.0)
ALT: 193 U/L — AB (ref 17–63)
AST: 391 U/L — ABNORMAL HIGH (ref 15–41)
Alkaline Phosphatase: 108 U/L (ref 38–126)
Anion gap: 13 (ref 5–15)
BILIRUBIN TOTAL: 0.8 mg/dL (ref 0.3–1.2)
BUN: 15 mg/dL (ref 6–20)
CALCIUM: 8.6 mg/dL — AB (ref 8.9–10.3)
CO2: 24 mmol/L (ref 22–32)
CREATININE: 1.46 mg/dL — AB (ref 0.61–1.24)
Chloride: 102 mmol/L (ref 101–111)
GFR calc Af Amer: >60 mL/min (ref >60)
GFR calc non Af Amer: >60 mL/min (ref >60)
GLUCOSE: 295 mg/dL — AB (ref 65–99)
POTASSIUM: 3.6 mmol/L (ref 3.5–5.1)
Sodium: 139 mmol/L (ref 135–145)
TOTAL PROTEIN: 7.9 g/dL (ref 6.5–8.1)

## 2016-11-05 LAB — CBC
HCT: 42.4 % (ref 40.0–52.0)
Hemoglobin: 14.1 g/dL (ref 13.0–18.0)
MCH: 27.5 pg (ref 26.0–34.0)
MCHC: 33.3 g/dL (ref 32.0–36.0)
MCV: 82.7 fL (ref 80.0–100.0)
PLATELETS: 454 10*3/uL — AB (ref 150–440)
RBC: 5.13 MIL/uL (ref 4.40–5.90)
RDW: 14.5 % (ref 11.5–14.5)
WBC: 10.8 10*3/uL — ABNORMAL HIGH (ref 3.8–10.6)

## 2016-11-05 LAB — SALICYLATE LEVEL: Salicylate Lvl: <7 mg/dL (ref 2.8–30.0)

## 2016-11-05 LAB — ACETAMINOPHEN LEVEL

## 2016-11-05 LAB — ETHANOL

## 2016-11-05 MED ORDER — ONDANSETRON HCL 4 MG/2ML IJ SOLN
INTRAMUSCULAR | Status: AC
  Filled 2016-11-05: qty 2

## 2016-11-05 MED ORDER — NALOXONE HCL 4 MG/0.1ML NA LIQD
1.0000 | Freq: Once | NASAL | Status: AC
  Administered 2016-11-05: 1 via NASAL

## 2016-11-05 MED ORDER — NALOXONE HCL 2 MG/2ML IJ SOSY
0.4000 mg | PREFILLED_SYRINGE | Freq: Once | INTRAMUSCULAR | Status: AC
  Administered 2016-11-05: 0.4 mg via INTRAMUSCULAR

## 2016-11-05 MED ORDER — NALOXONE HCL 2 MG/2ML IJ SOSY
0.4000 mg | PREFILLED_SYRINGE | Freq: Once | INTRAMUSCULAR | Status: AC
  Administered 2016-11-05: 0.4 mg via INTRAVENOUS

## 2016-11-05 MED ORDER — NALOXONE HCL 4 MG/0.1ML NA LIQD: NASAL | Status: AC

## 2016-11-05 MED ORDER — ONDANSETRON HCL 4 MG/2ML IJ SOLN
4.0000 mg | Freq: Once | INTRAMUSCULAR | Status: AC
  Administered 2016-11-05: 4 mg via INTRAVENOUS
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
## 2016-11-05 NOTE — Discharge Instructions (Signed)
Please seek medical attention for any high fevers, chest pain, shortness of breath, change in behavior, persistent vomiting, bloody stool or any other new or concerning symptoms.  

## 2016-11-05 NOTE — ED Provider Notes (Signed)
[facility] Emergency Department Provider Note  ____________________________________________   I have reviewed the triage vital signs and the nursing notes.   HISTORY  Chief Complaint Drug Overdose   History limited by: Altered Mental Status   HPI Joseph [name] is a 36 y.o. male who presents to the emergency department today via private vehicle because of concerns for decreased responsiveness and potential overdose. Patient is unable to give any history. Apparently companions think that he potentially used both cocaine and heroin tonight. Per chart review he has been seen in the emergency department for heroin overdose in the past.   Past Medical History:  Diagnosis Date  . Asthma   . Drug abuse   . Hypertension     Patient Active Problem List   Diagnosis Date Noted  . Seizure (HCC) 09/02/2015    Past Surgical History:  Procedure Laterality Date  . APPENDECTOMY    . BACK SURGERY    . CHOLECYSTECTOMY      Prior to Admission medications   Medication Sig Start Date End Date Taking? Authorizing Provider  Aspirin-Salicylamide-Caffeine (BC HEADACHE POWDER PO) Take 1 packet by mouth daily as needed.    Historical Provider, MD  gabapentin (NEURONTIN) 300 MG capsule Take 300-600 mg by mouth 3 (three) times daily. Two caps qam, 2 caps in the afternoon, and 1 qhs    Historical Provider, MD  levETIRAcetam (KEPPRA) 500 MG tablet Take 1 tablet (500 mg total) by mouth 2 (two) times daily. Patient not taking: Reported on 09/08/2016 04/29/16   Arnaldo Natal, MD  lisinopril (PRINIVIL,ZESTRIL) 20 MG tablet Take 1 tablet by mouth daily.    Historical Provider, MD  SUBOXONE 8-2 MG FILM Place 1 Film under the tongue 3 (three) times daily.    Historical Provider, MD  triamterene-hydrochlorothiazide (MAXZIDE-25) 37.5-25 MG tablet Take 1 tablet by mouth daily.     Historical Provider, MD    Allergies Penicillins  Family History  Problem Relation Age of Onset  .  Hypertension Other     Social History Social History  Substance Use Topics  . Smoking status: Current Every Day Smoker    Packs/day: 0.50    Types: Cigarettes  . Smokeless tobacco: Never Used  . Alcohol use No    Review of Systems Unable to obtain  ____________________________________________   PHYSICAL EXAM:  VITAL SIGNS: ED Triage Vitals [11/05/16 0153]  Enc Vitals Group     BP 138/78     Pulse 110     Resp 15     Temp      Temp src      SpO2      Weight (!) 350 lb (158.8 kg)     Height  (1.88 m)   Constitutional: Unresponsive to painful stimuli. Pulses present.  Eyes: Conjunctivae are normal. Pupils 2 mm bilaterally.  ENT   Head: Normocephalic and atraumatic.   Nose: No congestion/rhinnorhea.   Mouth/Throat: Mucous membranes are moist.   Neck: No stridor. Hematological/Lymphatic/Immunilogical: No cervical lymphadenopathy. Cardiovascular: Tachycardic, regular rhythm.  No murmurs, rubs, or gallops.  Respiratory: Decreased respiratory effort however breathing on his own.  Gastrointestinal: Soft, not distended.  Genitourinary: Deferred Musculoskeletal: Normal range of motion in all extremities. No lower extremity edema. Neurologic:  Unresponsive to painful stimuli. Breathing on his own.  Skin:  Skin is warm, dry and intact. No rash noted.   ____________________________________________    LABS (pertinent positives/negatives)  Labs Reviewed  COMPREHENSIVE METABOLIC PANEL - Abnormal; Notable for  the following:       Result Value   Glucose, Bld 295 (*)    Creatinine, Ser 1.46 (*)    Calcium 8.6 (*)    AST 391 (*)    ALT 193 (*)    All other components within normal limits  ACETAMINOPHEN LEVEL - Abnormal; Notable for the following:    Acetaminophen (Tylenol), Serum <10 (*)    All other components within normal limits  CBC - Abnormal; Notable for the following:    WBC 10.8 (*)    Platelets 454 (*)    All other components within normal  limits  URINE DRUG SCREEN, QUALITATIVE ([facility] ONLY) - Abnormal; Notable for the following:    Amphetamines, Ur Screen POSITIVE (*)    Cocaine Metabolite,Ur [place] POSITIVE (*)    Opiate, Ur Screen POSITIVE (*)    All other components within normal limits  ETHANOL  SALICYLATE LEVEL  CBG MONITORING, ED     ____________________________________________   EKG  I, Phineas Semen, attending physician, personally viewed and interpreted this EKG  EKG Time: 0204 Rate: 103 Rhythm: sinus tachycardia Axis: normal Intervals: qtc 519 QRS: narrow, q waves II, III, aVF ST changes: no st elevation Impression: abnormal ekg   ____________________________________________    RADIOLOGY  None  ____________________________________________   PROCEDURES  Procedures  CRITICAL CARE Performed by: Phineas Semen   Total critical care time: 35 minutes  Critical care time was exclusive of separately billable procedures and treating other patients.  Critical care was necessary to treat or prevent imminent or life-threatening deterioration.  Critical care was time spent personally by me on the following activities: development of treatment plan with patient and/or surrogate as well as nursing, discussions with consultants, evaluation of patient's response to treatment, examination of patient, obtaining history from patient or surrogate, ordering and performing treatments and interventions, ordering and review of laboratory studies, ordering and review of radiographic studies, pulse oximetry and re-evaluation of patient's condition.  ____________________________________________   INITIAL IMPRESSION / ASSESSMENT AND PLAN / ED COURSE  Pertinent labs & imaging results that were available during my care of the patient were reviewed by me and considered in my medical decision making (see chart for details).  I was called to the main entrance of the emergency department where I first observed the  patient on the ground outside of his vehicle being attended to by nursing staff. He had already received some intranasal Narcan and his color was slightly pale. Nursing staff said that he had turned blue. He was additionally given another 2 of Narcan IM in the parking lot. He was then transported to the emergency department where IV lines were obtained and he was given 2 of Narcan IV. Patient then did wake up briefly. He stated he did not remember what happened tonight. Plan will be to observe the patient emergency department on the monitor.  ____________________________________________   FINAL CLINICAL IMPRESSION(S) / ED DIAGNOSES  Final diagnoses:  Opiate overdose, accidental or unintentional, initial encounter     Note: This dictation was prepared with Dragon dictation. Any transcriptional errors that result from this process are unintentional     Phineas Semen, MD 11/05/16 [phone]

## 2016-11-05 NOTE — ED Notes (Signed)
Pt awakens when name called; still drowsy; will continue to monitor

## 2016-11-05 NOTE — ED Notes (Signed)
Pt denies memory of using drugs today. Pt presents with two unaccounted for puncture marks on right AC.

## 2016-11-05 NOTE — ED Notes (Signed)
Pt resting with eyes closed and unlabored respirations; oxygen via [place] at 2L/min; skin pink, dry; will continue to monitor

## 2016-11-05 NOTE — ED Notes (Signed)
Parents were at bedside but are leaving; pt will open eyes when name called, answer yes/no questions with shake of head and follow commands; not talking at this time; easily nods back off to sleep with even and unlabored respirations

## 2016-11-05 NOTE — ED Provider Notes (Signed)
Patient reevaluated. He is currently sober at this time. Denies any SI or HI. States it was purely recreational use. Patient has outpatient follow-up. Has been evaluated by TTS. Patient able to ambulate with steady gait. Patient stable for discharge.   Willy Eddy, MD 11/05/16 1110

## 2016-11-05 NOTE — ED Notes (Signed)
Eyes closed, resp even and unlabored 

## 2016-11-05 NOTE — ED Notes (Signed)
Family at bedside. 

## 2016-11-05 NOTE — ED Notes (Signed)
Pt was given IM Narcan before triage.

## 2016-11-05 NOTE — ED Notes (Signed)
Pt gave verbal consent for parents to come back to room and receive information about care given and needed.

## 2016-11-05 NOTE — BH Assessment (Signed)
Assessment Note  Joseph [name] is an 36 y.o. male who presents to the ER due to having an overdose of Heroin. Per his report, he was sober for eight years. However, several months ago, he relapsed on cocaine and Heroin. "This (is) like the third time I used in two months..." He believes "the batched was cut with fentanyl or something."   Patient denies SI/HI and AV/H. He currently receives outpatient with [facility]. He states he have been with them for approximately a year. Patient denies any involvement with the legal system and DSS. He also denies having a history violence and aggression.  Diagnosis: Opioid Use Disorder, Severe  Past Medical History:  Past Medical History:  Diagnosis Date  . Asthma   . Drug abuse   . Hypertension     Past Surgical History:  Procedure Laterality Date  . APPENDECTOMY    . BACK SURGERY    . CHOLECYSTECTOMY      Family History:  Family History  Problem Relation Age of Onset  . Hypertension Other     Social History:  reports that he has been smoking Cigarettes.  He has been smoking about 0.50 packs per day. He has never used smokeless tobacco. He reports that he uses drugs, including Cocaine, Marijuana, and IV. He reports that he does not drink alcohol.  Additional Social History:  Alcohol / Drug Use Pain Medications: See PTA Prescriptions: See PTA Over the Counter: See PTA History of alcohol / drug use?: Yes Longest period of sobriety (when/how long): "Eight years" Negative Consequences of Use: Personal relationships Withdrawal Symptoms:  (n/a) Substance #1 Name of Substance 1: Heroin 1 - Age of First Use: 21 1 - Amount (size/oz): $20 1 - Frequency: Two time use 1 - Duration: "I only used two times in the last three months" Recently relaspe 1 - Last Use / Amount: 11/04/2016 Substance #2 Name of Substance 2: Cocaine 2 - Age of First Use: 31 2 - Amount (size/oz): "About $50 a week" 2 - Frequency: "Three times a  week." 2 - Duration: "Five months" 2 - Last Use / Amount: 11/04/2016  CIWA: CIWA-Ar BP: 99/79 Pulse Rate: 84 COWS:    Allergies:  Allergies  Allergen Reactions  . Penicillins Rash    Has patient had a PCN reaction causing immediate rash, facial/tongue/throat swelling, SOB or lightheadedness with hypotension: Yes Has patient had a PCN reaction causing severe rash involving mucus membranes or skin necrosis: No Has patient had a PCN reaction that required hospitalization No Has patient had a PCN reaction occurring within the last 10 years: No If all of the above answers are "NO", then may proceed with Cephalosporin use.    Home Medications:  (Not in a hospital admission)  OB/GYN Status:  No LMP for male patient.  General Assessment Data Location of Assessment: [facility] ED TTS Assessment: In system Is this a Tele or Face-to-Face Assessment?: Face-to-Face Is this an Initial Assessment or a Re-assessment for this encounter?: Initial Assessment Marital status: Divorced Shell name: n/a Is patient pregnant?: No Pregnancy Status: No Living Arrangements: Parent Can pt return to current living arrangement?: Yes Admission Status: Involuntary Is patient capable of signing voluntary admission?: No (Under IVC ) Referral Source: Self/Family/Friend Insurance type: MCD  Medical Screening Exam [facility] Walk-in ONLY) Medical Exam completed: Yes  Crisis Care Plan Living Arrangements: Parent Legal Guardian: Other: (Self) Name of Psychiatrist: [facility] Name of Therapist: [facility]  Education Status Is  patient currently in school?: No Current Grade: n/a Highest grade of school patient has completed: High School Diploma Name of school: n/a Contact person: n/a  Risk to self with the past 6 months Suicidal Ideation: No Has patient been a risk to self within the past 6 months prior to admission? : No Suicidal Intent: No Has patient had any suicidal intent  within the past 6 months prior to admission? : No Is patient at risk for suicide?: No Suicidal Plan?: No Has patient had any suicidal plan within the past 6 months prior to admission? : No Access to Means: No What has been your use of drugs/alcohol within the last 12 months?: Cocaine & Heroin Previous Attempts/Gestures: No How many times?: 0 Other Self Harm Risks: Active Addiction Triggers for Past Attempts: None known Intentional Self Injurious Behavior: None Family Suicide History: No Recent stressful life event(s): Other (Comment) (Active Addiction) Persecutory voices/beliefs?: No Depression: No Depression Symptoms: Guilt Substance abuse history and/or treatment for substance abuse?: Yes Suicide prevention information given to non-admitted patients: Not applicable  Risk to Others within the past 6 months Homicidal Ideation: No Does patient have any lifetime risk of violence toward others beyond the six months prior to admission? : No Thoughts of Harm to Others: No Current Homicidal Intent: No Current Homicidal Plan: No Access to Homicidal Means: No Identified Victim: Reports of none History of harm to others?: No Assessment of Violence: None Noted Violent Behavior Description: Reports of none Does patient have access to weapons?: No Criminal Charges Pending?: No Does patient have a court date: No Is patient on probation?: No  Psychosis Hallucinations: None noted Delusions: None noted  Mental Status Report Appearance/Hygiene: Unremarkable, In hospital gown Eye Contact: Good Motor Activity: Freedom of movement, Unremarkable Speech: Logical/coherent, Unremarkable Level of Consciousness: Alert Mood: Pleasant, Euthymic Affect: Appropriate to circumstance Anxiety Level: None Thought Processes: Coherent, Relevant Judgement: Unimpaired Orientation: Person, Place, Time, Situation, Appropriate for developmental age Obsessive Compulsive Thoughts/Behaviors: None  Cognitive  Functioning Concentration: Normal Memory: Remote Intact, Recent Impaired IQ: Average Insight: Fair Impulse Control: Fair Appetite: Good Weight Loss: 0 Weight Gain: 0 Sleep: No Change Total Hours of Sleep: 7 Vegetative Symptoms: None  ADLScreening [facility] Services) Patient's cognitive ability adequate to safely complete daily activities?: Yes Patient able to express need for assistance with ADLs?: Yes Independently performs ADLs?: Yes (appropriate for developmental age)  Prior Inpatient Therapy Prior Inpatient Therapy: No Prior Therapy Dates: Reports of none Prior Therapy Facilty/Provider(s): Reports of none Reason for Treatment: Reports of none  Prior Outpatient Therapy Prior Outpatient Therapy: No Prior Therapy Dates: Reports of none Prior Therapy Facilty/Provider(s): Reports of none Reason for Treatment: Reports of none Does patient have an ACCT team?: No Does patient have Intensive In-House Services?  : No Does patient have [facility] services? : No Does patient have P4CC services?: No  ADL Screening (condition at time of admission) Patient's cognitive ability adequate to safely complete daily activities?: Yes Is the patient deaf or have difficulty hearing?: No Does the patient have difficulty seeing, even when wearing glasses/contacts?: No Does the patient have difficulty concentrating, remembering, or making decisions?: No Patient able to express need for assistance with ADLs?: Yes Does the patient have difficulty dressing or bathing?: No Independently performs ADLs?: Yes (appropriate for developmental age) Does the patient have difficulty walking or climbing stairs?: No Weakness of Legs: None Weakness of Arms/Hands: None  Home Assistive Devices/Equipment Home Assistive Devices/Equipment: None  Therapy Consults (therapy consults require a physician order)  PT Evaluation Needed: No OT Evalulation Needed: No SLP Evaluation Needed: No Abuse/Neglect  Assessment (Assessment to be complete while patient is alone) Physical Abuse: Denies Verbal Abuse: Denies Sexual Abuse: Denies Exploitation of patient/patient's resources: Denies Self-Neglect: Denies Values / Beliefs Cultural Requests During Hospitalization: None Spiritual Requests During Hospitalization: None Consults Spiritual Care Consult Needed: No Social Work Consult Needed: No Merchant navy officer (For Healthcare) Does Patient Have a Medical Advance Directive?: No Would patient like information on creating a medical advance directive?: No - Patient declined    Additional Information 1:1 In Past 12 Months?: No CIRT Risk: No Elopement Risk: No Does patient have medical clearance?: Yes  Child/Adolescent Assessment Running Away Risk: Denies (Patient is an adult )  Disposition:  Disposition Initial Assessment Completed for this Encounter: Yes Disposition of Patient: Other dispositions (ER MD Ordered Psych Consult)  On Site Evaluation by:   Reviewed with Physician:    Lilyan Gilford MS, LCAS, LPC, NCC, CCSI Therapeutic Triage Specialist 11/05/2016 4:45 PM

## 2017-09-24 IMAGING — CR DG ANKLE 2V *L*
1 series · 3 of 3 positions shown · non-contrast
Comparison: None.

CLINICAL DATA: Pain after a fall today.

EXAM:
LEFT ANKLE - 2 VIEW

[Series 1: ap · 0.17mm/px · 3 of 3 slices shown]
[im 1/3]
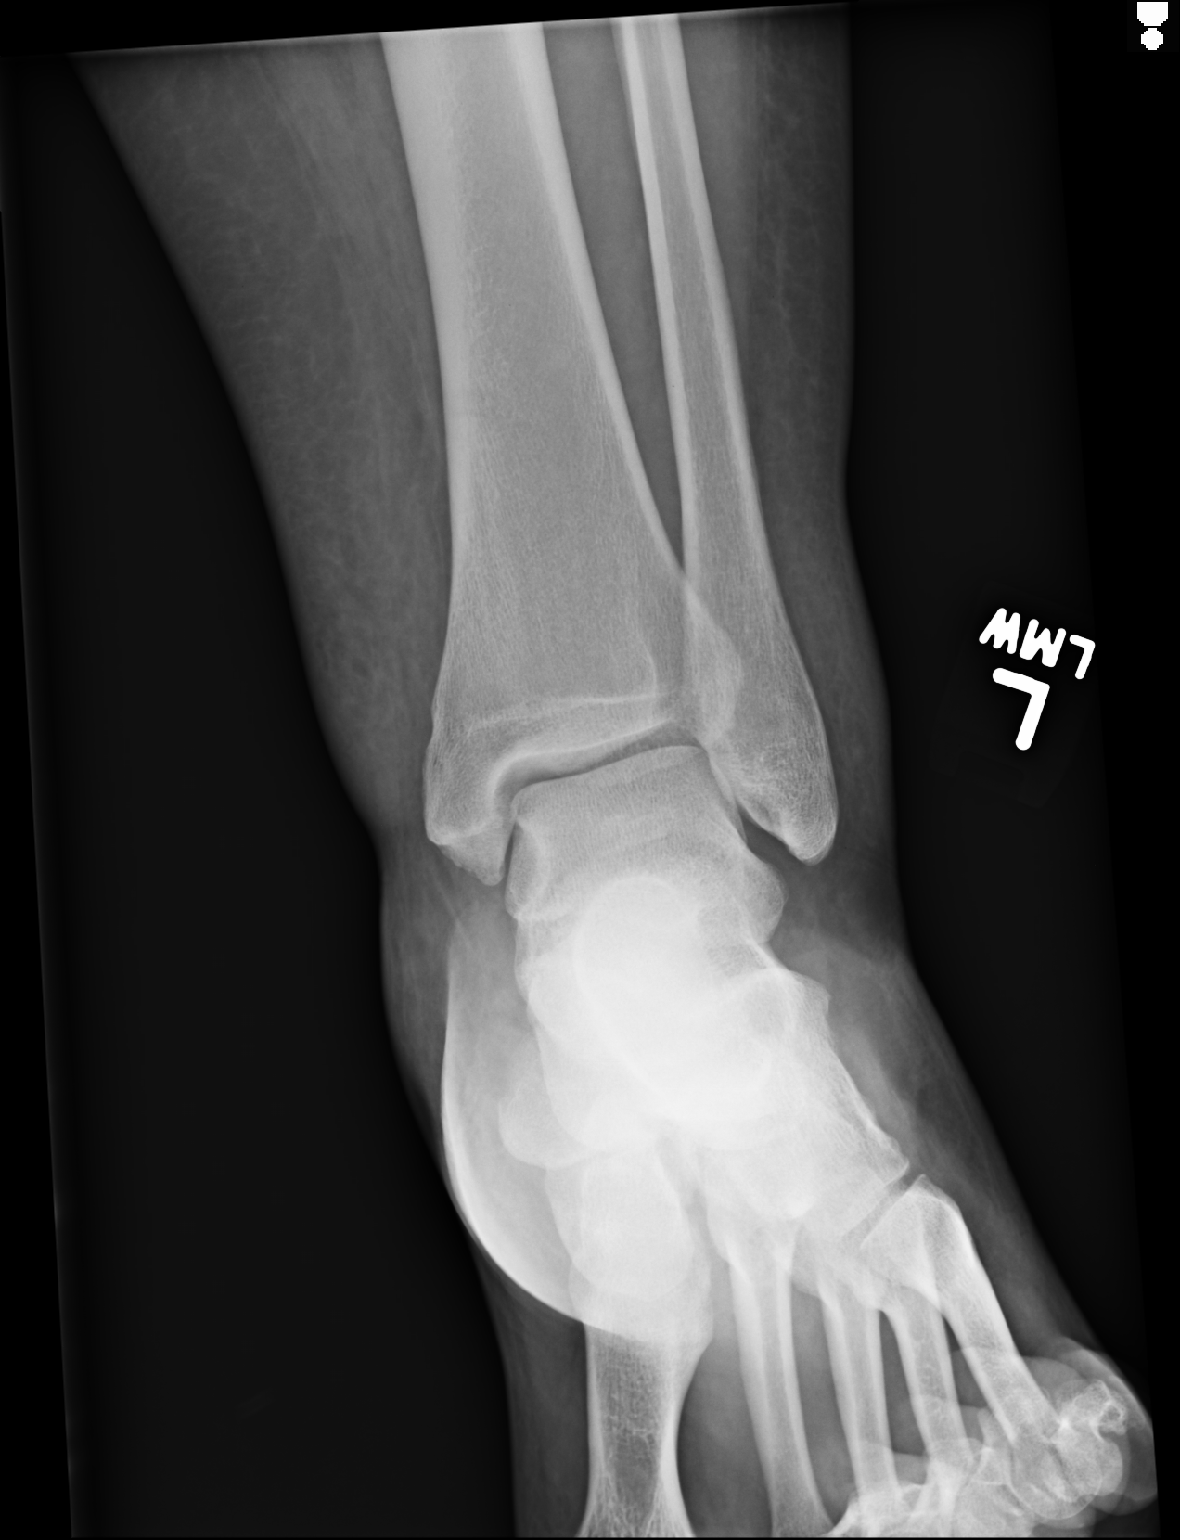
[im 2/3]
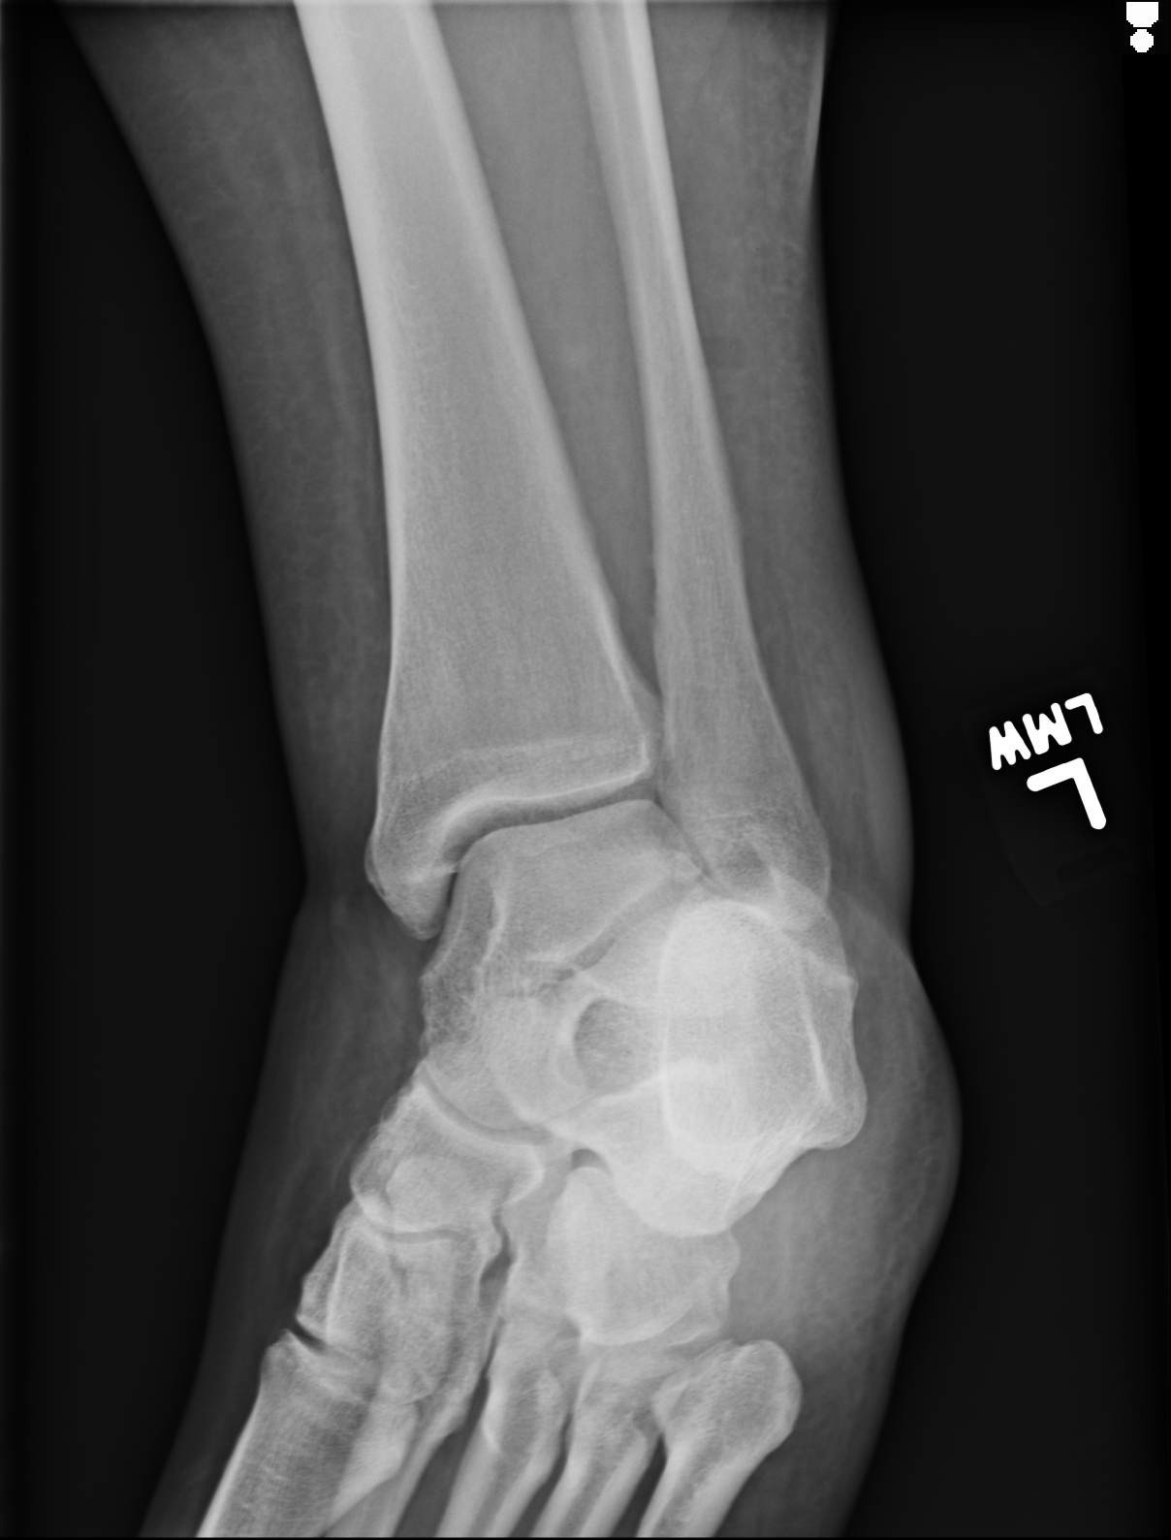
[im 3/3]
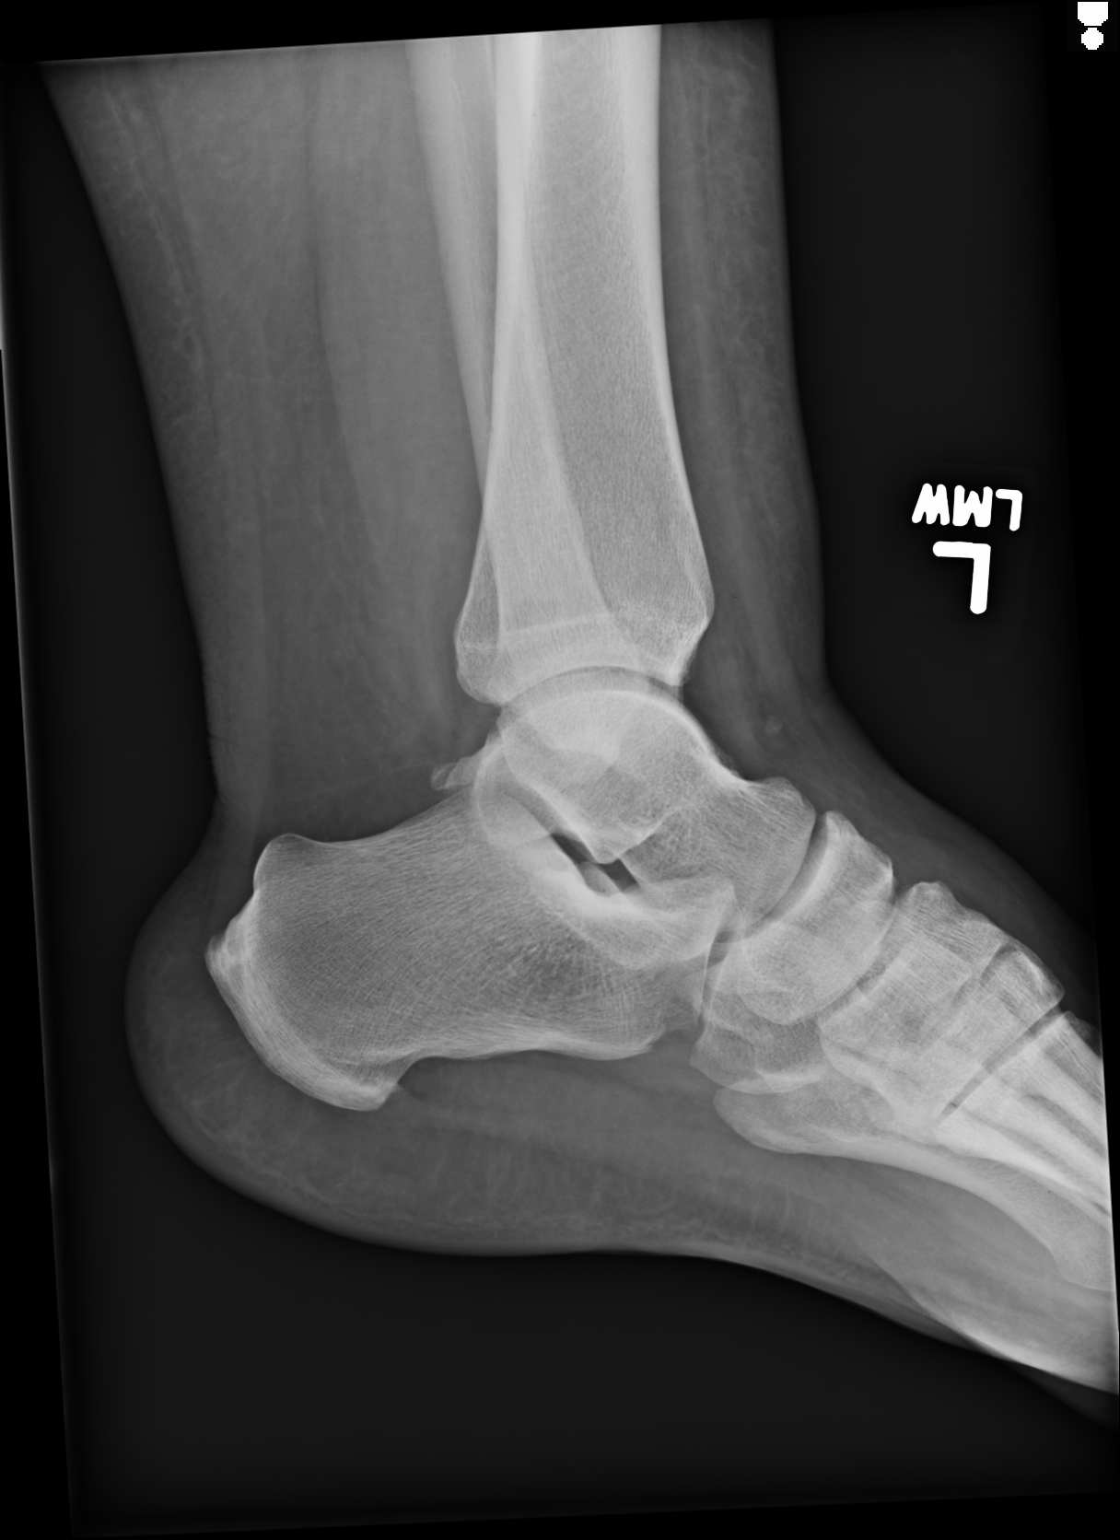

[3 of 3 positions shown; findings below may reference images not displayed]

FINDINGS: There is no evidence of fracture, dislocation, or joint effusion.
There is no evidence of arthropathy or other focal bone abnormality.
Soft tissues are unremarkable.
IMPRESSION: Negative.

## 2017-10-19 ENCOUNTER — Emergency Department: Payer: No Typology Code available for payment source

## 2017-10-19 ENCOUNTER — Emergency Department
Admission: EM | Admit: 2017-10-19 | Discharge: 2017-10-19 | Discharge status: Against Medical Advice | Payer: No Typology Code available for payment source | Attending: Emergency Medicine | Admitting: Emergency Medicine

## 2017-10-19 ENCOUNTER — Other Ambulatory Visit: Payer: Self-pay

## 2017-10-19 ENCOUNTER — Encounter: Payer: Self-pay | Admitting: Emergency Medicine

## 2017-10-19 DIAGNOSIS — M549 Dorsalgia, unspecified: Secondary | ICD-10-CM | POA: Insufficient documentation

## 2017-10-19 DIAGNOSIS — Y998 Other external cause status: Secondary | ICD-10-CM | POA: Diagnosis not present

## 2017-10-19 DIAGNOSIS — F17210 Nicotine dependence, cigarettes, uncomplicated: Secondary | ICD-10-CM | POA: Insufficient documentation

## 2017-10-19 DIAGNOSIS — M542 Cervicalgia: Secondary | ICD-10-CM | POA: Diagnosis present

## 2017-10-19 DIAGNOSIS — I10 Essential (primary) hypertension: Secondary | ICD-10-CM | POA: Insufficient documentation

## 2017-10-19 DIAGNOSIS — J45909 Unspecified asthma, uncomplicated: Secondary | ICD-10-CM | POA: Insufficient documentation

## 2017-10-19 DIAGNOSIS — M7918 Myalgia, other site: Secondary | ICD-10-CM | POA: Diagnosis not present

## 2017-10-19 DIAGNOSIS — Y939 Activity, unspecified: Secondary | ICD-10-CM | POA: Diagnosis not present

## 2017-10-19 DIAGNOSIS — Z79899 Other long term (current) drug therapy: Secondary | ICD-10-CM | POA: Insufficient documentation
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
## 2017-10-19 NOTE — ED Notes (Signed)
Pt walked out with friend from different room. Pt has not returned to room in over 10 minutes. Conversation was overheard that pt and pt's friend needed to be "somewhere" at 1330. Pt ambulatory.

## 2017-10-20 ENCOUNTER — Emergency Department: Payer: No Typology Code available for payment source

## 2017-10-20 ENCOUNTER — Encounter: Payer: Self-pay | Admitting: Emergency Medicine

## 2017-10-20 ENCOUNTER — Emergency Department
Admission: EM | Admit: 2017-10-20 | Discharge: 2017-10-20 | Disposition: A | Discharge status: Home / Self Care | Payer: No Typology Code available for payment source | Attending: Emergency Medicine | Admitting: Emergency Medicine

## 2017-10-20 ENCOUNTER — Other Ambulatory Visit: Payer: Self-pay

## 2017-10-20 DIAGNOSIS — J45909 Unspecified asthma, uncomplicated: Secondary | ICD-10-CM | POA: Diagnosis not present

## 2017-10-20 DIAGNOSIS — I10 Essential (primary) hypertension: Secondary | ICD-10-CM | POA: Insufficient documentation

## 2017-10-20 DIAGNOSIS — M7918 Myalgia, other site: Secondary | ICD-10-CM | POA: Insufficient documentation

## 2017-10-20 DIAGNOSIS — F17210 Nicotine dependence, cigarettes, uncomplicated: Secondary | ICD-10-CM | POA: Diagnosis not present

## 2017-10-20 DIAGNOSIS — Z79899 Other long term (current) drug therapy: Secondary | ICD-10-CM | POA: Insufficient documentation

## 2017-10-20 DIAGNOSIS — M542 Cervicalgia: Secondary | ICD-10-CM | POA: Diagnosis present

## 2017-10-20 DIAGNOSIS — Z9049 Acquired absence of other specified parts of digestive tract: Secondary | ICD-10-CM | POA: Diagnosis not present

## 2017-10-20 MED ORDER — ACETAMINOPHEN 325 MG PO TABS
650.0000 mg | ORAL_TABLET | Freq: Once | ORAL | Status: AC
  Administered 2017-10-20: 650 mg via ORAL
  Filled 2017-10-20: qty 2
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
## 2017-10-20 NOTE — ED Notes (Signed)
Pt oob in hall NAD. Pt states pain 9/10 with decreased rom

## 2017-10-20 NOTE — ED Notes (Signed)
Pt discharged to home.  Family member driving.  Discharge instructions reviewed.  Verbalized understanding.  No questions or concerns at this time.  Teach back verified.  Pt in NAD.  No items left in ED.   

## 2018-12-19 ENCOUNTER — Other Ambulatory Visit
Admission: RE | Admit: 2018-12-19 | Discharge: 2018-12-19 | Disposition: A | Discharge status: Home / Self Care | Payer: 59 | Source: Ambulatory Visit | Attending: Internal Medicine | Admitting: Internal Medicine

## 2018-12-19 DIAGNOSIS — M79662 Pain in left lower leg: Secondary | ICD-10-CM | POA: Diagnosis present

## 2018-12-19 DIAGNOSIS — M7989 Other specified soft tissue disorders: Secondary | ICD-10-CM | POA: Diagnosis present

## 2018-12-19 LAB — FIBRIN DERIVATIVES D-DIMER (ARMC ONLY): Fibrin derivatives D-dimer (ARMC): 481.03 ng/mL (FEU) (ref 0.00–499.00)

## 2019-01-29 ENCOUNTER — Other Ambulatory Visit (HOSPITAL_COMMUNITY): Payer: Self-pay | Admitting: General Surgery

## 2019-01-29 ENCOUNTER — Other Ambulatory Visit: Payer: Self-pay | Admitting: General Surgery

## 2019-01-29 DIAGNOSIS — R1011 Right upper quadrant pain: Secondary | ICD-10-CM

## 2019-02-04 ENCOUNTER — Ambulatory Visit
Admission: RE | Admit: 2019-02-04 | Discharge: 2019-02-04 | Disposition: A | Discharge status: Home / Self Care | Payer: 59 | Source: Ambulatory Visit | Attending: General Surgery | Admitting: General Surgery

## 2019-02-04 ENCOUNTER — Other Ambulatory Visit: Payer: Self-pay

## 2019-02-04 DIAGNOSIS — R1011 Right upper quadrant pain: Secondary | ICD-10-CM | POA: Diagnosis not present

## 2019-02-04 MED ORDER — IOHEXOL 300 MG/ML  SOLN: 100.0000 mL | Freq: Once | INTRAMUSCULAR | Status: DC | PRN

## 2019-02-04 MED ORDER — IOHEXOL 300 MG/ML  SOLN
125.0000 mL | Freq: Once | INTRAMUSCULAR | Status: AC | PRN
  Administered 2019-02-04: 125 mL via INTRAVENOUS

## 2019-10-05 ENCOUNTER — Ambulatory Visit: Payer: Self-pay

## 2019-10-09 ENCOUNTER — Ambulatory Visit: Payer: 59 | Admitting: Physician Assistant

## 2019-10-09 ENCOUNTER — Encounter: Payer: Self-pay | Admitting: Physician Assistant

## 2019-10-09 ENCOUNTER — Other Ambulatory Visit: Payer: Self-pay

## 2019-10-09 DIAGNOSIS — Z113 Encounter for screening for infections with a predominantly sexual mode of transmission: Secondary | ICD-10-CM

## 2019-10-09 DIAGNOSIS — E079 Disorder of thyroid, unspecified: Secondary | ICD-10-CM

## 2019-10-09 DIAGNOSIS — I10 Essential (primary) hypertension: Secondary | ICD-10-CM

## 2019-10-09 DIAGNOSIS — F419 Anxiety disorder, unspecified: Secondary | ICD-10-CM

## 2019-10-09 DIAGNOSIS — F909 Attention-deficit hyperactivity disorder, unspecified type: Secondary | ICD-10-CM

## 2019-10-09 LAB — GRAM STAIN

## 2019-10-09 NOTE — Progress Notes (Signed)
As client not yet returned to clinic, phone call to Lab and per [place], client no longer in lab. Client no found in main waiting room, first floor male bathroom or waiting room on Nurse Clinic hallway. Client not in exam room. Copy of gram stain obtained from lab and no interventions were required based on result. Beatris Si PA, notified of above. Jossie Ng, RN

## 2019-10-10 DIAGNOSIS — I10 Essential (primary) hypertension: Secondary | ICD-10-CM | POA: Insufficient documentation

## 2019-10-10 DIAGNOSIS — F900 Attention-deficit hyperactivity disorder, predominantly inattentive type: Secondary | ICD-10-CM | POA: Insufficient documentation

## 2019-10-10 DIAGNOSIS — F419 Anxiety disorder, unspecified: Secondary | ICD-10-CM | POA: Insufficient documentation

## 2019-10-10 DIAGNOSIS — E079 Disorder of thyroid, unspecified: Secondary | ICD-10-CM | POA: Insufficient documentation

## 2019-10-10 DIAGNOSIS — F909 Attention-deficit hyperactivity disorder, unspecified type: Secondary | ICD-10-CM | POA: Insufficient documentation

## 2019-10-10 NOTE — Progress Notes (Signed)
   [facility] Department STI clinic/screening visit  Subjective:  Joseph [name] is a 39 y.o. male being seen today for an STI screening visit. The patient reports they do not have symptoms.    Patient has the following medical conditions:   Patient Active Problem List   Diagnosis Date Noted  . Seizure (HCC) 09/02/2015     Chief Complaint  Patient presents with  . SEXUALLY TRANSMITTED DISEASE    HPI  Patient reports that he is not having any symptoms but his current partner was seen about 3 weeks ago and given a antibiotic for an infection but he is not sure what she was treated for.  States that she is using a vaginal treatment.  Reports that he has thyroid disorder, ADHD, HTN and takes medications for HTN and ADHD but is no longer on medicine for thyroid disorder or anxiety and depression though he is still in therapy.   See flowsheet for further details and programmatic requirements.    The following portions of the patient's history were reviewed and updated as appropriate: allergies, current medications, past medical history, past social history, past surgical history and problem list.  Objective:  There were no vitals filed for this visit.  Physical Exam Constitutional:      General: He is not in acute distress.    Appearance: Normal appearance. He is obese.  HENT:     Head: Normocephalic and atraumatic.     Comments: No nits, lice, or hair loss. No cervical, supraclavicular or axillary adenopathy.    Mouth/Throat:     Mouth: Mucous membranes are moist.     Pharynx: Oropharynx is clear. No oropharyngeal exudate or posterior oropharyngeal erythema.  Eyes:     Conjunctiva/sclera: Conjunctivae normal.  Pulmonary:     Effort: Pulmonary effort is normal.  Abdominal:     Palpations: Abdomen is soft. There is no mass.     Tenderness: There is no abdominal tenderness. There is no guarding or rebound.  Genitourinary:    Penis: Normal.      Testes:  Normal.     Comments: Pubic area without nits, lice, edema, erythema, lesions and inguinal adenopathy. Penis uncircumcised, no rash, lesions or discharge at meatus. Musculoskeletal:     Cervical back: Neck supple. No tenderness.  Skin:    General: Skin is warm and dry.     Findings: No bruising, erythema, lesion or rash.  Neurological:     Mental Status: He is alert and oriented to person, place, and time.  Psychiatric:        Mood and Affect: Mood normal.        Behavior: Behavior normal.        Thought Content: Thought content normal.        Judgment: Judgment normal.       Assessment and Plan:  Joseph [name] is a 39 y.o. male presenting to the [facility] Department for STI screening  1. Screening for STD (sexually transmitted disease) Patient into clinic without symptoms. Rec condoms with all sex. Await test results.  Counseled that RN will call if needs to RTC for treatment once results are back. Patient went to lab but did not return to clinic for posting. - Gram stain - Gonococcus culture - HIV [place] [facility] - Syphilis Serology, [place] [facility] - Gonococcus culture     No follow-ups on file.  No future appointments.  Matt Holmes, PA

## 2019-10-13 LAB — GONOCOCCUS CULTURE

## 2019-12-06 ENCOUNTER — Other Ambulatory Visit: Payer: Self-pay

## 2019-12-06 ENCOUNTER — Inpatient Hospital Stay
Admit: 2019-12-06 | Discharge: 2019-12-06 | Disposition: A | Discharge status: Home / Self Care | Payer: 59 | Attending: Internal Medicine | Admitting: Internal Medicine

## 2019-12-06 ENCOUNTER — Inpatient Hospital Stay: Payer: 59

## 2019-12-06 ENCOUNTER — Encounter: Payer: Self-pay | Admitting: Internal Medicine

## 2019-12-06 ENCOUNTER — Emergency Department: Payer: 59

## 2019-12-06 ENCOUNTER — Inpatient Hospital Stay
Admission: EM | Admit: 2019-12-06 | Discharge: 2019-12-08 | DRG: 683 | Disposition: A | Discharge status: Home / Self Care | Payer: 59 | Attending: Internal Medicine | Admitting: Internal Medicine

## 2019-12-06 DIAGNOSIS — R55 Syncope and collapse: Secondary | ICD-10-CM

## 2019-12-06 DIAGNOSIS — Z79899 Other long term (current) drug therapy: Secondary | ICD-10-CM | POA: Diagnosis not present

## 2019-12-06 DIAGNOSIS — N179 Acute kidney failure, unspecified: Principal | ICD-10-CM | POA: Diagnosis present

## 2019-12-06 DIAGNOSIS — Z20822 Contact with and (suspected) exposure to covid-19: Secondary | ICD-10-CM | POA: Diagnosis present

## 2019-12-06 DIAGNOSIS — Z8249 Family history of ischemic heart disease and other diseases of the circulatory system: Secondary | ICD-10-CM | POA: Diagnosis not present

## 2019-12-06 DIAGNOSIS — I10 Essential (primary) hypertension: Secondary | ICD-10-CM | POA: Diagnosis present

## 2019-12-06 DIAGNOSIS — Z87891 Personal history of nicotine dependence: Secondary | ICD-10-CM | POA: Diagnosis not present

## 2019-12-06 DIAGNOSIS — E079 Disorder of thyroid, unspecified: Secondary | ICD-10-CM | POA: Diagnosis not present

## 2019-12-06 DIAGNOSIS — F909 Attention-deficit hyperactivity disorder, unspecified type: Secondary | ICD-10-CM | POA: Diagnosis present

## 2019-12-06 DIAGNOSIS — E860 Dehydration: Secondary | ICD-10-CM | POA: Diagnosis present

## 2019-12-06 DIAGNOSIS — F419 Anxiety disorder, unspecified: Secondary | ICD-10-CM | POA: Diagnosis present

## 2019-12-06 DIAGNOSIS — E039 Hypothyroidism, unspecified: Secondary | ICD-10-CM | POA: Diagnosis present

## 2019-12-06 DIAGNOSIS — Z6841 Body Mass Index (BMI) 40.0 and over, adult: Secondary | ICD-10-CM

## 2019-12-06 DIAGNOSIS — R809 Proteinuria, unspecified: Secondary | ICD-10-CM | POA: Diagnosis present

## 2019-12-06 DIAGNOSIS — J45909 Unspecified asthma, uncomplicated: Secondary | ICD-10-CM | POA: Diagnosis present

## 2019-12-06 DIAGNOSIS — R569 Unspecified convulsions: Secondary | ICD-10-CM

## 2019-12-06 DIAGNOSIS — T502X5A Adverse effect of carbonic-anhydrase inhibitors, benzothiadiazides and other diuretics, initial encounter: Secondary | ICD-10-CM | POA: Diagnosis present

## 2019-12-06 DIAGNOSIS — S61512A Laceration without foreign body of left wrist, initial encounter: Principal | ICD-10-CM

## 2019-12-06 DIAGNOSIS — T39395A Adverse effect of other nonsteroidal anti-inflammatory drugs [NSAID], initial encounter: Secondary | ICD-10-CM | POA: Diagnosis present

## 2019-12-06 DIAGNOSIS — Z7982 Long term (current) use of aspirin: Secondary | ICD-10-CM

## 2019-12-06 DIAGNOSIS — T464X5A Adverse effect of angiotensin-converting-enzyme inhibitors, initial encounter: Secondary | ICD-10-CM | POA: Diagnosis present

## 2019-12-06 DIAGNOSIS — G40909 Epilepsy, unspecified, not intractable, without status epilepticus: Secondary | ICD-10-CM | POA: Diagnosis present

## 2019-12-06 DIAGNOSIS — W138XXA Fall from, out of or through other building or structure, initial encounter: Secondary | ICD-10-CM | POA: Diagnosis present

## 2019-12-06 DIAGNOSIS — S61522A Laceration with foreign body of left wrist, initial encounter: Secondary | ICD-10-CM | POA: Diagnosis present

## 2019-12-06 DIAGNOSIS — S66922A Laceration of unspecified muscle, fascia and tendon at wrist and hand level, left hand, initial encounter: Secondary | ICD-10-CM | POA: Diagnosis present

## 2019-12-06 LAB — URINE DRUG SCREEN, QUALITATIVE (ARMC ONLY)
Amphetamines, Ur Screen: POSITIVE — AB
Barbiturates, Ur Screen: NOT DETECTED
Benzodiazepine, Ur Scrn: NOT DETECTED
Cannabinoid 50 Ng, Ur ~~LOC~~: POSITIVE — AB
Cocaine Metabolite,Ur ~~LOC~~: NOT DETECTED
MDMA (Ecstasy)Ur Screen: NOT DETECTED
Methadone Scn, Ur: NOT DETECTED
Opiate, Ur Screen: NOT DETECTED
Phencyclidine (PCP) Ur S: NOT DETECTED
Tricyclic, Ur Screen: NOT DETECTED

## 2019-12-06 LAB — CBC
HCT: 47.3 % (ref 39.0–52.0)
Hemoglobin: 16 g/dL (ref 13.0–17.0)
MCH: 28.5 pg (ref 26.0–34.0)
MCHC: 33.8 g/dL (ref 30.0–36.0)
MCV: 84.2 fL (ref 80.0–100.0)
Platelets: 348 10*3/uL (ref 150–400)
RBC: 5.62 MIL/uL (ref 4.22–5.81)
RDW: 13.3 % (ref 11.5–15.5)
WBC: 13.2 10*3/uL — ABNORMAL HIGH (ref 4.0–10.5)
nRBC: 0 % (ref 0.0–0.2)

## 2019-12-06 LAB — RENAL FUNCTION PANEL
Albumin: 4 g/dL (ref 3.5–5.0)
Anion gap: 7 (ref 5–15)
BUN: 43 mg/dL — ABNORMAL HIGH (ref 6–20)
CO2: 28 mmol/L (ref 22–32)
Calcium: 8.3 mg/dL — ABNORMAL LOW (ref 8.9–10.3)
Chloride: 102 mmol/L (ref 98–111)
Creatinine, Ser: 3.27 mg/dL — ABNORMAL HIGH (ref 0.61–1.24)
GFR calc Af Amer: 26 mL/min — ABNORMAL LOW (ref >60)
GFR calc non Af Amer: 23 mL/min — ABNORMAL LOW (ref >60)
Glucose, Bld: 112 mg/dL — ABNORMAL HIGH (ref 70–99)
Phosphorus: 4.4 mg/dL (ref 2.5–4.6)
Potassium: 4.4 mmol/L (ref 3.5–5.1)
Sodium: 137 mmol/L (ref 135–145)

## 2019-12-06 LAB — HEPATIC FUNCTION PANEL
ALT: 38 U/L (ref 0–44)
AST: 68 U/L — ABNORMAL HIGH (ref 15–41)
Albumin: 4.6 g/dL (ref 3.5–5.0)
Alkaline Phosphatase: 82 U/L (ref 38–126)
Bilirubin, Direct: 0.1 mg/dL (ref 0.0–0.2)
Indirect Bilirubin: 0.7 mg/dL (ref 0.3–0.9)
Total Bilirubin: 0.8 mg/dL (ref 0.3–1.2)
Total Protein: 8 g/dL (ref 6.5–8.1)

## 2019-12-06 LAB — URINALYSIS, COMPLETE (UACMP) WITH MICROSCOPIC
Bacteria, UA: NONE SEEN
Bilirubin Urine: NEGATIVE
Glucose, UA: NEGATIVE mg/dL
Ketones, ur: NEGATIVE mg/dL
Leukocytes,Ua: NEGATIVE
Nitrite: NEGATIVE
Protein, ur: 100 mg/dL — AB
Specific Gravity, Urine: 1.02 (ref 1.005–1.030)
Squamous Epithelial / LPF: NONE SEEN (ref 0–5)
pH: 5.5 (ref 5.0–8.0)

## 2019-12-06 LAB — BASIC METABOLIC PANEL
Anion gap: 12 (ref 5–15)
BUN: 40 mg/dL — ABNORMAL HIGH (ref 6–20)
CO2: 27 mmol/L (ref 22–32)
Calcium: 8.9 mg/dL (ref 8.9–10.3)
Chloride: 98 mmol/L (ref 98–111)
Creatinine, Ser: 4.25 mg/dL — ABNORMAL HIGH (ref 0.61–1.24)
GFR calc Af Amer: 19 mL/min — ABNORMAL LOW (ref >60)
GFR calc non Af Amer: 17 mL/min — ABNORMAL LOW (ref >60)
Glucose, Bld: 104 mg/dL — ABNORMAL HIGH (ref 70–99)
Potassium: 3.6 mmol/L (ref 3.5–5.1)
Sodium: 137 mmol/L (ref 135–145)

## 2019-12-06 LAB — RESPIRATORY PANEL BY RT PCR (FLU A&B, COVID)
Influenza A by PCR: NEGATIVE
Influenza B by PCR: NEGATIVE
SARS Coronavirus 2 by RT PCR: NEGATIVE

## 2019-12-06 LAB — ETHANOL: Alcohol, Ethyl (B): <10 mg/dL (ref <10)

## 2019-12-06 MED ORDER — PERFLUTREN LIPID MICROSPHERE
1.0000 mL | INTRAVENOUS | Status: AC | PRN
  Administered 2019-12-06: 5 mL via INTRAVENOUS
  Filled 2019-12-06: qty 10

## 2019-12-06 MED ORDER — HEPARIN SODIUM (PORCINE) 5000 UNIT/ML IJ SOLN
5000.0000 [IU] | Freq: Three times a day (TID) | INTRAMUSCULAR | Status: DC
  Administered 2019-12-06 – 2019-12-08 (×5): 5000 [IU] via SUBCUTANEOUS
  Filled 2019-12-06 (×5): qty 1

## 2019-12-06 MED ORDER — HYDRALAZINE HCL 20 MG/ML IJ SOLN: 10.0000 mg | Freq: Four times a day (QID) | INTRAMUSCULAR | Status: DC | PRN

## 2019-12-06 MED ORDER — ONDANSETRON HCL 4 MG PO TABS: 4.0000 mg | ORAL_TABLET | Freq: Four times a day (QID) | ORAL | Status: DC | PRN

## 2019-12-06 MED ORDER — ACETAMINOPHEN 650 MG RE SUPP: 650.0000 mg | Freq: Four times a day (QID) | RECTAL | Status: DC | PRN

## 2019-12-06 MED ORDER — NICOTINE 21 MG/24HR TD PT24
21.0000 mg | MEDICATED_PATCH | Freq: Every day | TRANSDERMAL | Status: DC
  Administered 2019-12-06 – 2019-12-07 (×2): 21 mg via TRANSDERMAL
  Filled 2019-12-06 (×3): qty 1

## 2019-12-06 MED ORDER — GABAPENTIN 300 MG PO CAPS
600.0000 mg | ORAL_CAPSULE | Freq: Two times a day (BID) | ORAL | Status: DC
  Administered 2019-12-06 – 2019-12-08 (×5): 600 mg via ORAL
  Filled 2019-12-06 (×5): qty 2

## 2019-12-06 MED ORDER — AMPHETAMINE-DEXTROAMPHETAMINE 5 MG PO TABS: 10.0000 mg | ORAL_TABLET | Freq: Two times a day (BID) | ORAL | Status: DC

## 2019-12-06 MED ORDER — LEVETIRACETAM 500 MG PO TABS
500.0000 mg | ORAL_TABLET | Freq: Two times a day (BID) | ORAL | Status: DC
  Administered 2019-12-06 – 2019-12-08 (×5): 500 mg via ORAL
  Filled 2019-12-06 (×5): qty 1

## 2019-12-06 MED ORDER — ONDANSETRON HCL 4 MG/2ML IJ SOLN: 4.0000 mg | Freq: Four times a day (QID) | INTRAMUSCULAR | Status: DC | PRN

## 2019-12-06 MED ORDER — SODIUM CHLORIDE 0.9 % IV BOLUS
1000.0000 mL | Freq: Once | INTRAVENOUS | Status: AC
  Administered 2019-12-06: 1000 mL via INTRAVENOUS

## 2019-12-06 MED ORDER — SODIUM CHLORIDE 0.9 % IV SOLN: INTRAVENOUS | Status: DC

## 2019-12-06 MED ORDER — AMPHETAMINE-DEXTROAMPHETAMINE 5 MG PO TABS
10.0000 mg | ORAL_TABLET | Freq: Two times a day (BID) | ORAL | Status: DC
  Administered 2019-12-06 – 2019-12-08 (×4): 10 mg via ORAL
  Filled 2019-12-06 (×5): qty 2

## 2019-12-06 MED ORDER — GABAPENTIN 300 MG PO CAPS
300.0000 mg | ORAL_CAPSULE | Freq: Every day | ORAL | Status: DC
  Administered 2019-12-06 – 2019-12-07 (×2): 300 mg via ORAL
  Filled 2019-12-06 (×2): qty 1

## 2019-12-06 MED ORDER — GABAPENTIN 300 MG PO CAPS: 300.0000 mg | ORAL_CAPSULE | Freq: Three times a day (TID) | ORAL | Status: DC

## 2019-12-06 MED ORDER — LIDOCAINE HCL (PF) 1 % IJ SOLN
30.0000 mL | Freq: Once | INTRAMUSCULAR | Status: AC
  Administered 2019-12-06: 30 mL

## 2019-12-06 MED ORDER — ACETAMINOPHEN 325 MG PO TABS
650.0000 mg | ORAL_TABLET | Freq: Four times a day (QID) | ORAL | Status: DC | PRN
  Administered 2019-12-06 – 2019-12-07 (×3): 650 mg via ORAL
  Filled 2019-12-06 (×3): qty 2

## 2019-12-06 MED ORDER — LEVOTHYROXINE SODIUM 100 MCG PO TABS
100.0000 ug | ORAL_TABLET | Freq: Every day | ORAL | Status: DC
  Administered 2019-12-06 – 2019-12-08 (×3): 100 ug via ORAL
  Filled 2019-12-06 (×3): qty 1

## 2019-12-06 MED ORDER — BUPRENORPHINE HCL-NALOXONE HCL 8-2 MG SL FILM
1.0000 | ORAL_FILM | Freq: Three times a day (TID) | SUBLINGUAL | Status: DC
  Administered 2019-12-06 – 2019-12-07 (×3): 1 via SUBLINGUAL
  Filled 2019-12-06 (×4): qty 1

## 2019-12-06 NOTE — ED Triage Notes (Addendum)
Reports when standing he become lighthead.  Reports he has fallen x 2.  Reports with one fall he landed on glass table top that broke.  Patient with laceration to left wrist area.   Patient reports left hand is numb and having difficulty moving his fingers.

## 2019-12-06 NOTE — ED Notes (Signed)
  This RN made unsuccessful attempts at PIV insertion. Elon Jester RN to bedside to attempt PIV insertion.

## 2019-12-06 NOTE — ED Notes (Signed)
US at bedside

## 2019-12-06 NOTE — Progress Notes (Signed)
The patient arrived from the ED. Alert and oriented x4 with intermittent confusion. The patient forgot he had the laceration on the left wrist. Suture site in left wrist bleeding minimal amount. Vital signs had been soft at the ED. Vital signs have been trending slowly upward before being transfer to the floor from the ED. On arrival to the floor the patient blood pressure was 88/72. MD was notified. The patient was asked if he had home medications and he provided a small bag with  30 packs of sublingual  suboxone which was counted in front of the patient by 2 nurses and taken to the pharmacy. The patient was placed on a bed alarm for risk of falls due to admission hx provided. 3 hours after admission. The bed alarm went off. The patient was on the side of the bed trying to pick up personal medications "gabapentin tablets". His left wrist was bleeding at this time yet no damage to suture site noticed. Bleeding was controlled and hospitalist was notified. It was reported to the patient that gabapentin tablets had to be stored and saved with pharmacy. 39 and a half tablets of gabapentin were saved. 2 personal small devices were also saved with pharmacy which seem to be electronic smoking devices. The patient was concern and wanted to take gabapentin, suboxone and adderall as he was taking it at home. It was reported to the patient that the medication were timed and could not be provided as he was taking them at home. He was educated on medication overdosing which could occur. The patient reported that if he did not obtained his medications as he was at home he would find a way to take his own. After receiving doses of home order suboxone, adderall, gabapentin he has been drowsy. BP has been decreased again 70/50. MD has order 1L of NS and IV fluid rate has been increased to 27ml/hr.

## 2019-12-06 NOTE — Consult Note (Signed)
[address] [place], [place] [address] Phone [phone]. Fax [phone]  Date: 12/06/2019                  Patient Name:  Joseph [name]  MRN: [id number]  DOB: 1981/04/03  Age / Sex: 39 y.o., male         PCP: [facility]                 Service Requesting Consult: IM/ Collier Bullock, MD                 Reason for Consult: ARF            History of Present Illness: Patient is a 39 y.o. male with medical problems of substance abuse, Hypertension, ADHD, thyroid disorder, anxiety, history of seizures who was admitted to [facility] on 12/06/2019 for evaluation of AKI (acute kidney injury) ([place]) [N17.9] Laceration of left wrist, initial encounter [E17.408X]  Patient has altered mental status and is very lethargic.  He is not able to provide any meaningful information.  All information is obtained from primary team, ER team and the chart Patient presented to the emergency room for concern of dizziness and laceration of his left wrist.  Apparently he fell and crashed through a glass table which caused laceration of his left wrist. Labs show creatinine of 4.25. Patient received multiple boluses of IV fluids in the ER Nephrology consult has now been requested for evaluation   Medications: Outpatient medications: Medications Prior to Admission  Medication Sig Dispense Refill Last Dose  . amphetamine-dextroamphetamine (ADDERALL) 10 MG tablet Take 10 mg by mouth 2 (two) times daily.     . furosemide (LASIX) 40 MG tablet Take by mouth.     . gabapentin (NEURONTIN) 300 MG capsule Take 300-600 mg by mouth 3 (three) times daily. Two caps qam, 2 caps in the afternoon, and 1 qhs     . SUBOXONE 8-2 MG FILM Place 1 Film under the tongue in the morning and at bedtime.      . Aspirin-Salicylamide-Caffeine (BC HEADACHE POWDER PO) Take 1 packet by mouth daily as needed.     . levETIRAcetam (KEPPRA) 500 MG tablet Take 1 tablet (500 mg total) by mouth 2 (two) times daily. (Patient  not taking: Reported on 09/08/2016) 602 tablet 2 Not Taking at Unknown time  . levothyroxine (SYNTHROID) 100 MCG tablet Take by mouth.   Not Taking at Unknown time  . lisinopril (PRINIVIL,ZESTRIL) 20 MG tablet Take 1 tablet by mouth daily.     . naloxone (NARCAN) nasal spray 4 mg/0.1 mL Use in the event of suspected overdose of heroin or opiates 1 kit o   . triamterene-hydrochlorothiazide (MAXZIDE-25) 37.5-25 MG tablet Take 1 tablet by mouth daily.    Not Taking at Unknown time    Current medications: Current Facility-Administered Medications  Medication Dose Route Frequency Provider Last Rate Last Admin  . 0.9 %  sodium chloride infusion   Intravenous Continuous Agbata, Tochukwu, MD 150 mL/hr at 12/06/19 1148 New Bag at 12/06/19 1148  . acetaminophen (TYLENOL) tablet 650 mg  650 mg Oral Q6H PRN Agbata, Tochukwu, MD       Or  . acetaminophen (TYLENOL) suppository 650 mg  650 mg Rectal Q6H PRN Agbata, Tochukwu, MD      . Buprenorphine HCl-Naloxone HCl 8-2 MG FILM 1 Film  1 Film Sublingual TID Agbata, Tochukwu, MD      . gabapentin (NEURONTIN) capsule 600 mg  600 mg  Oral BID Agbata, Tochukwu, MD       And  . gabapentin (NEURONTIN) capsule 300 mg  300 mg Oral QHS Agbata, Tochukwu, MD      . heparin injection 5,000 Units  5,000 Units Subcutaneous Q8H Agbata, Tochukwu, MD      . hydrALAZINE (APRESOLINE) injection 10 mg  10 mg Intravenous Q6H PRN Agbata, Tochukwu, MD      . levETIRAcetam (KEPPRA) tablet 500 mg  500 mg Oral BID Agbata, Tochukwu, MD      . levothyroxine (SYNTHROID) tablet 100 mcg  100 mcg Oral Q0600 Agbata, Tochukwu, MD      . ondansetron (ZOFRAN) tablet 4 mg  4 mg Oral Q6H PRN Agbata, Tochukwu, MD       Or  . ondansetron (ZOFRAN) injection 4 mg  4 mg Intravenous Q6H PRN Agbata, Tochukwu, MD          Allergies: No Known Allergies    Past Medical History: Past Medical History:  Diagnosis Date  . Asthma   . Drug abuse ([place])   . Hypertension      Past Surgical  History: Past Surgical History:  Procedure Laterality Date  . APPENDECTOMY    . BACK SURGERY    . CHOLECYSTECTOMY       Family History: Family History  Problem Relation Age of Onset  . Hypertension Other      Social History: Social History   Socioeconomic History  . Marital status: Single    Spouse name: Not on file  . Number of children: Not on file  . Years of education: Not on file  . Highest education level: Not on file  Occupational History  . Not on file  Tobacco Use  . Smoking status: Current Every Day Smoker    Packs/day: 0.50    Years: 15.00    Pack years: 7.50    Types: Cigarettes  . Smokeless tobacco: Never Used  Substance and Sexual Activity  . Alcohol use: No  . Drug use: Yes    Types: Cocaine, Marijuana, IV    Comment: Percocets, sniffs heroin  . Sexual activity: Not on file  Other Topics Concern  . Not on file  Social History Narrative  . Not on file   Social Determinants of Health   Financial Resource Strain:   . Difficulty of Paying Living Expenses:   Food Insecurity:   . Worried About Charity fundraiser in the Last Year:   . Arboriculturist in the Last Year:   Transportation Needs:   . Film/video editor (Medical):   Marland Kitchen Lack of Transportation (Non-Medical):   Physical Activity:   . Days of Exercise per Week:   . Minutes of Exercise per Session:   Stress:   . Feeling of Stress :   Social Connections:   . Frequency of Communication with Friends and Family:   . Frequency of Social Gatherings with Friends and Family:   . Attends Religious Services:   . Active Member of Clubs or Organizations:   . Attends Archivist Meetings:   Marland Kitchen Marital Status:   Intimate Partner Violence:   . Fear of Current or Ex-Partner:   . Emotionally Abused:   Marland Kitchen Physically Abused:   . Sexually Abused:      Review of Systems: Not available due to altered mental status Gen:  HEENT:  CV:  Resp:  GI: GU :  MS:  Derm:    Psych: Heme:   Neuro:  Endocrine  Vital Signs: Blood pressure 109/65, pulse 80, temperature 98.1 F (36.7 C), temperature source Oral, resp. rate 18, height 6' 3"  (1.905 m), weight (!) 146.1 kg, SpO2 100 %.   Intake/Output Summary (Last 24 hours) at 12/06/2019 1156 Last data filed at 12/06/2019 1041 Gross per 24 hour  Intake 1000 ml  Output --  Net 1000 ml    Weight trends: [employer]   12/06/19 0432 12/06/19 0442  Weight: (!) 146.1 kg (!) 146.1 kg    Physical Exam: General:  Ill-appearing, laying in the bed  HEENT  dry oral mucous membranes  Neck:  Short, thick  Lungs:  Normal breathing effort, sleep apnea pattern of breathing  Heart::  No rub  Abdomen:  Soft, nontender, bowel sounds are present  Extremities:  No edema, Left wrist laceration, sutured  Neurologic:  Lethargic, momentarily arousable but then falls back to sleep  Skin:  Normal turgor, no acute rashes    Lab results: Basic Metabolic Panel: Recent Labs  Lab 12/06/19 0527  NA 137  K 3.6  CL 98  CO2 27  GLUCOSE 104*  BUN 40*  CREATININE 4.25*  CALCIUM 8.9    Liver Function Tests: Recent Labs  Lab 12/06/19 0527  AST 68*  ALT 38  ALKPHOS 82  BILITOT 0.8  PROT 8.0  ALBUMIN 4.6   No results for input(s): LIPASE, AMYLASE in the last 168 hours. No results for input(s): AMMONIA in the last 168 hours.  CBC: Recent Labs  Lab 12/06/19 0527  WBC 13.2*  HGB 16.0  HCT 47.3  MCV 84.2  PLT 348    Cardiac Enzymes: No results for input(s): CKTOTAL, TROPONINI in the last 168 hours.  BNP: Invalid input(s): POCBNP  CBG: No results for input(s): GLUCAP in the last 168 hours.  Microbiology: Recent Results (from the past 720 hour(s))  Respiratory Panel by RT PCR (Flu A&B, Covid) - Nasopharyngeal Swab     Status: None   Collection Time: 12/06/19  9:05 AM   Specimen: Nasopharyngeal Swab  Result Value Ref Range Status   SARS Coronavirus 2 by RT PCR NEGATIVE NEGATIVE Final    Comment: (NOTE) SARS-CoV-2  target nucleic acids are NOT DETECTED. The SARS-CoV-2 RNA is generally detectable in upper respiratoy specimens during the acute phase of infection. The lowest concentration of SARS-CoV-2 viral copies this assay can detect is 131 copies/mL. A negative result does not preclude SARS-Cov-2 infection and should not be used as the sole basis for treatment or other patient management decisions. A negative result may occur with  improper specimen collection/handling, submission of specimen other than nasopharyngeal swab, presence of viral mutation(s) within the areas targeted by this assay, and inadequate number of viral copies (<131 copies/mL). A negative result must be combined with clinical observations, patient history, and epidemiological information. The expected result is Negative. Fact Sheet for Patients:  PinkCheek.be Fact Sheet for Healthcare Providers:  GravelBags.it This test is not yet ap proved or cleared by the [place] FDA and  has been authorized for detection and/or diagnosis of SARS-CoV-2 by FDA under an Emergency Use Authorization (EUA). This EUA will remain  in effect (meaning this test can be used) for the duration of the COVID-19 declaration under Section 564(b)(1) of the Act, 21 U.S.C. section 360bbb-3(b)(1), unless the authorization is terminated or revoked sooner.    Influenza A by PCR NEGATIVE NEGATIVE Final   Influenza B by PCR NEGATIVE NEGATIVE Final    Comment: (NOTE) The Xpert Xpress SARS-CoV-2/FLU/RSV assay is  intended as an aid in  the diagnosis of influenza from Nasopharyngeal swab specimens and  should not be used as a sole basis for treatment. Nasal washings and  aspirates are unacceptable for Xpert Xpress SARS-CoV-2/FLU/RSV  testing. Fact Sheet for Patients: PinkCheek.be Fact Sheet for Healthcare Providers: GravelBags.it This test is  not yet approved or cleared by the [place] FDA and  has been authorized for detection and/or diagnosis of SARS-CoV-2 by  FDA under an Emergency Use Authorization (EUA). This EUA will remain  in effect (meaning this test can be used) for the duration of the  Covid-19 declaration under Section 564(b)(1) of the Act, 21  U.S.C. section 360bbb-3(b)(1), unless the authorization is  terminated or revoked. Performed at [facility], [address] [place], [place] [address]      Coagulation Studies: No results for input(s): LABPROT, INR in the last 72 hours.  Urinalysis: No results for input(s): COLORURINE, LABSPEC, PHURINE, GLUCOSEU, HGBUR, BILIRUBINUR, KETONESUR, PROTEINUR, UROBILINOGEN, NITRITE, LEUKOCYTESUR in the last 72 hours.  Invalid input(s): APPERANCEUR      Imaging: DG Wrist Complete Left  Result Date: 12/06/2019 CLINICAL DATA:  Fall through glass table EXAM: LEFT WRIST - COMPLETE 3+ VIEW COMPARISON:  None. FINDINGS: Laceration of the volar surface of the left wrist. No osseous abnormality. Small radiopaque foreign body at the proximal aspect of the laceration, visible only on the lateral projection. IMPRESSION: Laceration of the volar surface of the left wrist with small radiopaque foreign body at the proximal aspect of the laceration, visible only on the lateral projection. Electronically Signed   By: Ulyses Jarred M.D.   On: 12/06/2019 06:42   US RENAL  Result Date: 12/06/2019 CLINICAL DATA:  Acute kidney injury, history asthma, drug abuse, hypertension, smoker EXAM: RENAL / URINARY TRACT ULTRASOUND COMPLETE COMPARISON:  CT abdomen and pelvis 02/04/2019 FINDINGS: Right Kidney: Renal measurements: 11.7 x 6.8 x 6.1 cm = volume: 257 mL. Normal cortical thickness and echogenicity. Suboptimal visualization due to body habitus. No evidence of renal mass or hydronephrosis. Left Kidney: Renal measurements: 11.4 x 7.2 x 7.3 cm = volume: 318 mL. Suboptimally visualized due to  body habitus. Grossly normal cortical thickness and echogenicity. No mass or hydronephrosis. Bladder: Contains minimal urine, suboptimally evaluated. Other: N/A IMPRESSION: No renal sonographic abnormalities identified. Electronically Signed   By: Lavonia Dana M.D.   On: 12/06/2019 09:56   DG Knee Complete 4 Views Left  Result Date: 12/06/2019 CLINICAL DATA:  Fall through glass table EXAM: LEFT KNEE - COMPLETE 4+ VIEW COMPARISON:  None. FINDINGS: No evidence of fracture, dislocation, or joint effusion. No evidence of arthropathy or other focal bone abnormality. Soft tissues are unremarkable. IMPRESSION: Negative. Electronically Signed   By: Ulyses Jarred M.D.   On: 12/06/2019 06:38      Assessment & Plan: Pt is a 39 y.o. Caucasian  male withmedical problems of substance abuse, Hypertension, ADHD, thyroid disorder, anxiety, history of seizures , was admitted on 12/06/2019 with AKI (acute kidney injury) ([place]) [N17.9] Laceration of left wrist, initial encounter [N62.952W]  #.  Acute kidney injury Baseline creatinine 0.8 from Dec 19, 2018 Work-up so far includes a renal ultrasound which is unremarkable negative for obstruction Patient was hypotensive upon arrival suggesting possibility of prerenal/ATN related AKI Urinalysis is pending Home medications include furosemide, BCs, lisinopril, triamterene/HCTZ Would hold all the above Agree with IV fluid resuscitation Awaiting urinalysis, urine tox screen, blood alcohol level-urine tox screen positive for cocaine in 2018 Supportive care Repeat renal  panel this afternoon      LOS: 0 Hue Frick 5/2/202111:56 AM    Note: This note was prepared with Dragon dictation. Any transcription errors are unintentional

## 2019-12-06 NOTE — Progress Notes (Signed)
*  PRELIMINARY RESULTS* Echocardiogram 2D Echocardiogram has been performed. Definity IV Contrast was used on this study.  Joseph [name] Joseph [name] 12/06/2019, 3:52 PM

## 2019-12-06 NOTE — ED Notes (Signed)
Messaged admitting MD about pt BP. Orders to change IVF from 152ml/hr to 156ml/hr.

## 2019-12-06 NOTE — ED Notes (Signed)
Pt placed on 4 L [place] d/t oxygen sats being in 80's. Pt falls right back asleep after being woken up. Denies taking pain pills.

## 2019-12-06 NOTE — H&P (Signed)
History and Physical    FINNIAN HUSTED NWG:[id number] DOB: 01/12/1981 DOA: 12/06/2019  PCP: [facility]   Patient coming from: Home  I have personally briefly reviewed patient's old medical records in [facility] Link  Chief Complaint: Dizziness  Most of the history was obtained from the ER notes. Patient is lethargic and unable to provide any history  HPI: Joseph [name] is a 39 y.o. male with medical history significant for hypertension, obesity (BMI 40), asthma and substance abuse who presents to the emergency room for evaluation of dizziness.  Patient complained of feeling dizzy when he gets up and tries to walk around.  Because of the dizziness patient fell and crushed through a table with a glass and suffered a laceration of his left wrist. I am unable to do a review of systems on this patient due to his mental status Labs revealed a serum creatinine of 4.25 when compared to a baseline of 1.46 from 2018 and elevated BUN of 40 compared to 15 from 3 years ago. X-ray of the left wrist showed laceration of the volar surface of the left wrist with small radiopaque foreign body at the proximal aspect of the laceration,visible only on the lateral projection. Patient's laceration was sutured in the emergency room  ED Course: Patient was seen in the emergency room for evaluation of dizziness and lightheadedness as well as a near syncopal episode with a left wrist laceration.  Patient noted to have a severe tendon near slightly lateral of midline.  ER physician had discussed with hand surgeon at [facility] who recommended washout and closure.  Patient will be admitted to the hospitalist service for acute kidney injury.  Review of Systems: As per HPI otherwise 10 point review of systems negative.  See above hypotensive pressure   Past Medical History:  Diagnosis Date  . Asthma   . Drug abuse ([facility])   . Hypertension     Past Surgical History:  Procedure Laterality Date  . APPENDECTOMY     . BACK SURGERY    . CHOLECYSTECTOMY       reports that he has been smoking cigarettes. He has a 7.50 pack-year smoking history. He has never used smokeless tobacco. He reports current drug use. Drugs: Cocaine, Marijuana, and IV. He reports that he does not drink alcohol.  No Known Allergies  Family History  Problem Relation Age of Onset  . Hypertension Other      Prior to Admission medications   Medication Sig Start Date End Date Taking? Authorizing Provider  Aspirin-Salicylamide-Caffeine (BC HEADACHE POWDER PO) Take 1 packet by mouth daily as needed.    [provider]  gabapentin (NEURONTIN) 300 MG capsule Take 300-600 mg by mouth 3 (three) times daily. Two caps qam, 2 caps in the afternoon, and 1 qhs    [provider]  levETIRAcetam (KEPPRA) 500 MG tablet Take 1 tablet (500 mg total) by mouth 2 (two) times daily. Patient not taking: Reported on 09/08/2016 04/29/16   Arnaldo Natal, MD  lisinopril (PRINIVIL,ZESTRIL) 20 MG tablet Take 1 tablet by mouth daily.    [provider]  naloxone [facility]) nasal spray 4 mg/0.1 mL Use in the event of suspected overdose of heroin or opiates 11/05/16   Willy Eddy, MD  SUBOXONE 8-2 MG FILM Place 1 Film under the tongue 3 (three) times daily.    [provider]  triamterene-hydrochlorothiazide (MAXZIDE-25) 37.5-25 MG tablet Take 1 tablet by mouth daily.     [provider]    Physical Exam: Vitals:   12/06/19 0745 12/06/19 0800 12/06/19 0830 12/06/19 0900  BP:      Pulse:      Resp: 18 19 18 15   Temp:      TempSrc:      SpO2:      Weight:      Height:         Vitals:   12/06/19 0745 12/06/19 0800 12/06/19 0830 12/06/19 0900  BP:      Pulse:      Resp: 18 19 18 15   Temp:      TempSrc:      SpO2:      Weight:      Height:        Constitutional: NAD, Lethargic. Opens eyes to loud verbal stimuli but falls right back to sleep Eyes: PERRL, lids and conjunctivae normal ENMT: Mucous  membranes are moist.  Neck: normal, supple, no masses, no thyromegaly Respiratory: clear to auscultation bilaterally, no wheezing, no crackles. Normal respiratory effort. No accessory muscle use.  Cardiovascular: Regular rate and rhythm, no murmurs / rubs / gallops. No extremity edema. 2+ pedal pulses. No carotid bruits.  Abdomen: no tenderness, no masses palpated. No hepatosplenomegaly. Bowel sounds positive. Central adiposity  Musculoskeletal: no clubbing / cyanosis. No joint deformity upper and lower extremities. Laceration of left wrist Skin: no rashes, lesions, ulcers.  Neurologic: No gross focal neurologic deficit. Psychiatric: Normal mood and affect.   Labs on Admission: I have personally reviewed following labs and imaging studies  CBC: Recent Labs  Lab 12/06/19 0527  WBC 13.2*  HGB 16.0  HCT 47.3  MCV 84.2  PLT 935   Basic Metabolic Panel: Recent Labs  Lab 12/06/19 0527  NA 137  K 3.6  CL 98  CO2 27  GLUCOSE 104*  BUN 40*  CREATININE 4.25*  CALCIUM 8.9   GFR: Estimated Creatinine Clearance: 36.7 mL/min (A) (by C-G formula based on SCr of 4.25 mg/dL (H)). Liver Function Tests: Recent Labs  Lab 12/06/19 0527  AST 68*  ALT 38  ALKPHOS 82  BILITOT 0.8  PROT 8.0  ALBUMIN 4.6   No results for input(s): LIPASE, AMYLASE in the last 168 hours. No results for input(s): AMMONIA in the last 168 hours. Coagulation Profile: No results for input(s): INR, PROTIME in the last 168 hours. Cardiac Enzymes: No results for input(s): CKTOTAL, CKMB, CKMBINDEX, TROPONINI in the last 168 hours. BNP (last 3 results) No results for input(s): PROBNP in the last 8760 hours. HbA1C: No results for input(s): HGBA1C in the last 72 hours. CBG: No results for input(s): GLUCAP in the last 168 hours. Lipid Profile: No results for input(s): CHOL, HDL, LDLCALC, TRIG, CHOLHDL, LDLDIRECT in the last 72 hours. Thyroid Function Tests: No results for input(s): TSH, T4TOTAL, FREET4,  T3FREE, THYROIDAB in the last 72 hours. Anemia Panel: No results for input(s): VITAMINB12, FOLATE, FERRITIN, TIBC, IRON, RETICCTPCT in the last 72 hours. Urine analysis:    Component Value Date/Time   COLORURINE STRAW (A) 04/29/2016 1720   APPEARANCEUR CLEAR (A) 04/29/2016 1720   LABSPEC 1.017 04/29/2016 1720   PHURINE 6.0 04/29/2016 1720   GLUCOSEU NEGATIVE 04/29/2016 1720   HGBUR NEGATIVE 04/29/2016 1720   BILIRUBINUR NEGATIVE 04/29/2016 1720   KETONESUR NEGATIVE 04/29/2016 1720   PROTEINUR 30 (A) 04/29/2016 1720   NITRITE NEGATIVE 04/29/2016 1720   LEUKOCYTESUR NEGATIVE 04/29/2016 1720    Radiological Exams on Admission: DG Wrist Complete Left  Result Date: 12/06/2019  CLINICAL DATA:  Fall through glass table EXAM: LEFT WRIST - COMPLETE 3+ VIEW COMPARISON:  None. FINDINGS: Laceration of the volar surface of the left wrist. No osseous abnormality. Small radiopaque foreign body at the proximal aspect of the laceration, visible only on the lateral projection. IMPRESSION: Laceration of the volar surface of the left wrist with small radiopaque foreign body at the proximal aspect of the laceration, visible only on the lateral projection. Electronically Signed   By: Deatra Robinson M.D.   On: 12/06/2019 06:42   DG Knee Complete 4 Views Left  Result Date: 12/06/2019 CLINICAL DATA:  Fall through glass table EXAM: LEFT KNEE - COMPLETE 4+ VIEW COMPARISON:  None. FINDINGS: No evidence of fracture, dislocation, or joint effusion. No evidence of arthropathy or other focal bone abnormality. Soft tissues are unremarkable. IMPRESSION: Negative. Electronically Signed   By: Deatra Robinson M.D.   On: 12/06/2019 06:38    EKG: Independently reviewed.  Sinus tachycardia  Assessment/Plan Principal Problem:   AKI (acute kidney injury) (HCC) Active Problems:   Seizure (HCC)   HTN (hypertension)   Thyroid disorder   Acute kidney injury  Most likely medication induced, patient was on lisinopril, Dyazide  and nonsteroidal anti-inflammatory agents which will be discontinued Aggressive IV fluid hydration Obtain renal ultrasound We will request nephrology consult Obtain total CK levels to rule out rhabdomyolysis  Near syncope Unclear etiology Orthostatic blood pressure check Obtain 2D echocardiogram to assess LVEF Cardiac monitoring to rule out arrhythmias   Seizure disorder Continue Keppra Seizure precautions   Hypertension Will place patient on IV hydralazine for SBP >   Hypothyroidism Stable Continue Synthroid  DVT prophylaxis: Heparin Code Status: Full Family Communication: Greater than 50% of time was spent discussing patient condition and plan of care with him at the bedside.  All questions and concerns have been addressed.  He verbalizes understanding and agrees with the plan. Disposition Plan: Back to previous home environment Consults called: Nephrology    Lucile Shutters MD [facility]     12/06/2019, 9:03 AM

## 2019-12-06 NOTE — ED Provider Notes (Signed)
[facility] Emergency Department Provider Note   ____________________________________________   I have reviewed the triage vital signs and the nursing notes.   HISTORY  Chief Complaint Dizziness   History limited by: Not Limited   HPI Joseph [name] is a 39 y.o. male who presents to the emergency department today because of concern for both dizziness and laceration. The patient states that he started feeling dizzy yesterday. The dizziness would occur when he would get up and try to walk around. Because of this he did fall and crash through a table with a glass top and suffered a laceration to his left wrist. The patient states that he was up to check his blood pressure. He denies any chest pain or shortness of breath. Denies any fevers.   Records reviewed. Per medical record review patient has a history of asthma, anxiety, heroin use.   Past Medical History:  Diagnosis Date  . Asthma   . Drug abuse (HCC)   . Hypertension     Patient Active Problem List   Diagnosis Date Noted  . ADHD 10/10/2019  . HTN (hypertension) 10/10/2019  . Thyroid disorder 10/10/2019  . Anxiety 10/10/2019  . Seizure (HCC) 09/02/2015    Past Surgical History:  Procedure Laterality Date  . APPENDECTOMY    . BACK SURGERY    . CHOLECYSTECTOMY      Prior to Admission medications   Medication Sig Start Date End Date Taking? Authorizing Provider  Aspirin-Salicylamide-Caffeine (BC HEADACHE POWDER PO) Take 1 packet by mouth daily as needed.    [provider]  gabapentin (NEURONTIN) 300 MG capsule Take 300-600 mg by mouth 3 (three) times daily. Two caps qam, 2 caps in the afternoon, and 1 qhs    [provider]  levETIRAcetam (KEPPRA) 500 MG tablet Take 1 tablet (500 mg total) by mouth 2 (two) times daily. Patient not taking: Reported on 09/08/2016 04/29/16   Arnaldo Natal, MD  lisinopril (PRINIVIL,ZESTRIL) 20 MG tablet Take 1 tablet by mouth daily.     [provider]  naloxone [facility]) nasal spray 4 mg/0.1 mL Use in the event of suspected overdose of heroin or opiates 11/05/16   Willy Eddy, MD  SUBOXONE 8-2 MG FILM Place 1 Film under the tongue 3 (three) times daily.    [provider]  triamterene-hydrochlorothiazide (MAXZIDE-25) 37.5-25 MG tablet Take 1 tablet by mouth daily.     [provider]    Allergies Patient has no known allergies.  Family History  Problem Relation Age of Onset  . Hypertension Other     Social History Social History   Tobacco Use  . Smoking status: Current Every Day Smoker    Packs/day: 0.50    Years: 15.00    Pack years: 7.50    Types: Cigarettes  . Smokeless tobacco: Never Used  Substance Use Topics  . Alcohol use: No  . Drug use: Yes    Types: Cocaine, Marijuana, IV    Comment: Percocets, sniffs heroin    Review of Systems Constitutional: No fever/chills Eyes: No visual changes. ENT: No sore throat. Cardiovascular: Denies chest pain. Respiratory: Denies shortness of breath. Gastrointestinal: No abdominal pain.  No nausea, no vomiting.  No diarrhea.   Genitourinary: Negative for dysuria. Musculoskeletal: Negative for back pain. Skin: Positive for laceration to left wrist.  Neurological: Positive for lightheadedness.   ____________________________________________   PHYSICAL EXAM:  VITAL SIGNS: ED Triage Vitals [12/06/19 0432]  Enc Vitals Group  BP (!) 144/105     Pulse Rate (!) 110     Resp 19     Temp 98.1 F (36.7 C)     Temp Source Oral     SpO2 95 %     Weight (!) 322 lb (146.1 kg)     Height 6\' 2"  (1.88 m)   Constitutional: Alert and oriented.  Eyes: Conjunctivae are normal.  ENT      Head: Normocephalic and atraumatic.      Nose: No congestion/rhinnorhea.      Mouth/Throat: Mucous membranes are moist.      Neck: No stridor. Hematological/Lymphatic/Immunilogical: No cervical lymphadenopathy. Cardiovascular: Normal rate, regular  rhythm.  No murmurs, rubs, or gallops.  Respiratory: Normal respiratory effort without tachypnea nor retractions. Breath sounds are clear and equal bilaterally. No wheezes/rales/rhonchi. Gastrointestinal: Soft and non tender. No rebound. No guarding.  Genitourinary: Deferred Musculoskeletal: Normal range of motion in all extremities. No lower extremity edema. Neurologic:  Normal speech and language. No gross focal neurologic deficits are appreciated.  Skin:  Skin is warm, dry and intact. No rash noted. Psychiatric: Mood and affect are normal. Speech and behavior are normal. Patient exhibits appropriate insight and judgment.  ____________________________________________    LABS (pertinent positives/negatives)  CBC wbc 13.2, hgb 16.0, plt 348 BMP na 137, k 3.6, cr 4.25  ____________________________________________   EKG  I, Nance Pear, attending physician, personally viewed and interpreted this EKG  EKG Time: 0440 Rate: 109 Rhythm: sinus tachycardia Axis: normal Intervals: qtc 482 QRS: narrow ST changes: no st elevation Impression: abnormal  ____________________________________________    RADIOLOGY  Left wrist Laceration. Foreign body. No fracture  Left knee No fracture/dislocation ____________________________________________   PROCEDURES  Procedures  LACERATION REPAIR Performed by: Nance Pear Authorized by: Nance Pear Consent: Verbal consent obtained. Risks and benefits: risks, benefits and alternatives were discussed Consent given by: patient Patient identity confirmed: provided demographic data Prepped and Draped in normal sterile fashion Wound explored  Laceration Location: left wrist  Laceration Length: 5 cm  No Foreign Bodies seen or palpated  Anesthesia: local infiltration  Local anesthetic: lidocaine 1% without epinephrine  Anesthetic total: 7 ml  Irrigation method: syringe Amount of cleaning: standard  Skin closure: 4-0  prolene  Number of sutures: 20  Technique: simple interrupted  Patient tolerance: Patient tolerated the procedure well with no immediate complications.  ____________________________________________   INITIAL IMPRESSION / ASSESSMENT AND PLAN / ED COURSE  Pertinent labs & imaging results that were available during my care of the patient were reviewed by me and considered in my medical decision making (see chart for details).   Patient presented to the emergency department today because of concerns for left wrist laceration as well as dizziness.  On exam patient has roughly a 5 cm laceration to his left wrist.  There is a severed tendon near slightly lateral of midline, roughly 4-5 mm in diameter. On testing of ROM it does appear patient has full ROM, able to flex all digits. Able to touch thumb to pinky. Sensation is intact over all finger pads however patient does say that sensation to thumb and index finger are slightly decreased. Discussed with Dr. Jeannie Fend with hand surgery at [facility]. At this time Dr. Jeannie Fend feels likely a superficial tendon. Does recommend wash out and closure.  In terms of dizziness blood work returned and showed a significant AKI. Will plan on admission to the hospitalist service.   ____________________________________________   FINAL CLINICAL IMPRESSION(S) / ED DIAGNOSES  Final diagnoses:  Laceration of left wrist, initial encounter  AKI (acute kidney injury) [facility])     Note: This dictation was prepared with Dragon dictation. Any transcriptional errors that result from this process are unintentional     Phineas Semen, MD 12/06/19 [id number]

## 2019-12-07 LAB — BASIC METABOLIC PANEL
Anion gap: 6 (ref 5–15)
BUN: 32 mg/dL — ABNORMAL HIGH (ref 6–20)
CO2: 29 mmol/L (ref 22–32)
Calcium: 8.4 mg/dL — ABNORMAL LOW (ref 8.9–10.3)
Chloride: 102 mmol/L (ref 98–111)
Creatinine, Ser: 1.33 mg/dL — ABNORMAL HIGH (ref 0.61–1.24)
GFR calc Af Amer: >60 mL/min (ref >60)
GFR calc non Af Amer: >60 mL/min (ref >60)
Glucose, Bld: 107 mg/dL — ABNORMAL HIGH (ref 70–99)
Potassium: 4.3 mmol/L (ref 3.5–5.1)
Sodium: 137 mmol/L (ref 135–145)

## 2019-12-07 LAB — CBC
HCT: 42.9 % (ref 39.0–52.0)
Hemoglobin: 14 g/dL (ref 13.0–17.0)
MCH: 28.5 pg (ref 26.0–34.0)
MCHC: 32.6 g/dL (ref 30.0–36.0)
MCV: 87.2 fL (ref 80.0–100.0)
Platelets: 287 10*3/uL (ref 150–400)
RBC: 4.92 MIL/uL (ref 4.22–5.81)
RDW: 13.7 % (ref 11.5–15.5)
WBC: 7.5 10*3/uL (ref 4.0–10.5)
nRBC: 0 % (ref 0.0–0.2)

## 2019-12-07 LAB — ECHOCARDIOGRAM COMPLETE
Height: 75 in
Weight: 5152 oz

## 2019-12-07 LAB — HIV ANTIBODY (ROUTINE TESTING W REFLEX): HIV Screen 4th Generation wRfx: NONREACTIVE

## 2019-12-07 MED ORDER — BUPRENORPHINE HCL-NALOXONE HCL 8-2 MG SL SUBL
1.0000 | SUBLINGUAL_TABLET | Freq: Two times a day (BID) | SUBLINGUAL | Status: DC
  Administered 2019-12-07 – 2019-12-08 (×2): 1 via SUBLINGUAL
  Filled 2019-12-07: qty 1

## 2019-12-07 NOTE — Progress Notes (Signed)
PROGRESS NOTE    Joseph [name]  VZD:[id number] DOB: Dec 08, 1980 DOA: 12/06/2019 PCP: [facility]    Brief Narrative:  Joseph [name] is a 39 y.o. male with medical history significant for hypertension, obesity (BMI 40), asthma and substance abuse who presents to the emergency room for evaluation of dizziness.  Patient complained of feeling dizzy when he gets up and tries to walk around.  Because of the dizziness patient fell and crushed through a table with a glass and suffered a laceration of his left wrist. Labs revealed a serum creatinine of 4.25 when compared to a baseline of 1.46 from 2018 and elevated BUN of 40 compared to 15 from 3 years ago. X-ray of the left wrist showed laceration of the volar surface of the left wrist with small radiopaque foreign body at the proximal aspect of the laceration,visible only on the lateral projection.    Consultants:   Nephrology, orthopedics  Procedures: xray of writst  Antimicrobials:     Subjective: Has no complaints. Wants to know why he is getting ivf   Objective: Vitals:   12/06/19 1844 12/06/19 2022 12/06/19 2335 12/07/19 0426  BP: (!) 73/57 (!) 92/58 106/64 (!) 100/54  Pulse: 73 66 68 68  Resp: 14 14 15 16   Temp: 97.8 F (36.6 C) 97.7 F (36.5 C) 98.6 F (37 C) 98.1 F (36.7 C)  TempSrc: Axillary Oral Oral Oral  SpO2: 93% 97% 95% 100%  Weight:      Height:        Intake/Output Summary (Last 24 hours) at 12/07/2019 0840 Last data filed at 12/07/2019 0504 Gross per 24 hour  Intake 2973.21 ml  Output 2000 ml  Net 973.21 ml   Filed Weights   12/06/19 0432 12/06/19 0442  Weight: (!) 146.1 kg (!) 146.1 kg    Examination:  General exam: Appears calm and comfortable , nad Respiratory system: Clear to auscultation. Respiratory effort normal. Cardiovascular system: S1 & S2 heard, RRR. No JVD, murmurs, rubs, gallops or clicks.  Gastrointestinal system: Abdomen is nondistended, soft and nontender.  Normal bowel  sounds heard. Central nervous system: Alert and oriented. Grossly intact Extremities: no edema Skin: warm, dry Psychiatry: Judgement and insight appear normal. Mood & affect appropriate.     Data Reviewed: I have personally reviewed following labs and imaging studies  CBC: Recent Labs  Lab 12/06/19 0527 12/07/19 0605  WBC 13.2* 7.5  HGB 16.0 14.0  HCT 47.3 42.9  MCV 84.2 87.2  PLT 348 287   Basic Metabolic Panel: Recent Labs  Lab 12/06/19 0527 12/06/19 1415  NA 137 137  K 3.6 4.4  CL 98 102  CO2 27 28  GLUCOSE 104* 112*  BUN 40* 43*  CREATININE 4.25* 3.27*  CALCIUM 8.9 8.3*  PHOS  --  4.4   GFR: Estimated Creatinine Clearance: 47.7 mL/min (A) (by C-G formula based on SCr of 3.27 mg/dL (H)). Liver Function Tests: Recent Labs  Lab 12/06/19 0527 12/06/19 1415  AST 68*  --   ALT 38  --   ALKPHOS 82  --   BILITOT 0.8  --   PROT 8.0  --   ALBUMIN 4.6 4.0   No results for input(s): LIPASE, AMYLASE in the last 168 hours. No results for input(s): AMMONIA in the last 168 hours. Coagulation Profile: No results for input(s): INR, PROTIME in the last 168 hours. Cardiac Enzymes: No results for input(s): CKTOTAL, CKMB, CKMBINDEX, TROPONINI in the last 168 hours. BNP (last 3  results) No results for input(s): PROBNP in the last 8760 hours. HbA1C: No results for input(s): HGBA1C in the last 72 hours. CBG: No results for input(s): GLUCAP in the last 168 hours. Lipid Profile: No results for input(s): CHOL, HDL, LDLCALC, TRIG, CHOLHDL, LDLDIRECT in the last 72 hours. Thyroid Function Tests: No results for input(s): TSH, T4TOTAL, FREET4, T3FREE, THYROIDAB in the last 72 hours. Anemia Panel: No results for input(s): VITAMINB12, FOLATE, FERRITIN, TIBC, IRON, RETICCTPCT in the last 72 hours. Sepsis Labs: No results for input(s): PROCALCITON, LATICACIDVEN in the last 168 hours.  Recent Results (from the past 240 hour(s))  Respiratory Panel by RT PCR (Flu A&B, Covid) -  Nasopharyngeal Swab     Status: None   Collection Time: 12/06/19  9:05 AM   Specimen: Nasopharyngeal Swab  Result Value Ref Range Status   SARS Coronavirus 2 by RT PCR NEGATIVE NEGATIVE Final    Comment: (NOTE) SARS-CoV-2 target nucleic acids are NOT DETECTED. The SARS-CoV-2 RNA is generally detectable in upper respiratoy specimens during the acute phase of infection. The lowest concentration of SARS-CoV-2 viral copies this assay can detect is 131 copies/mL. A negative result does not preclude SARS-Cov-2 infection and should not be used as the sole basis for treatment or other patient management decisions. A negative result may occur with  improper specimen collection/handling, submission of specimen other than nasopharyngeal swab, presence of viral mutation(s) within the areas targeted by this assay, and inadequate number of viral copies (<131 copies/mL). A negative result must be combined with clinical observations, patient history, and epidemiological information. The expected result is Negative. Fact Sheet for Patients:  https://www.moore.com/ Fact Sheet for Healthcare Providers:  https://www.young.biz/ This test is not yet ap proved or cleared by the [place] FDA and  has been authorized for detection and/or diagnosis of SARS-CoV-2 by FDA under an Emergency Use Authorization (EUA). This EUA will remain  in effect (meaning this test can be used) for the duration of the COVID-19 declaration under Section 564(b)(1) of the Act, 21 U.S.C. section 360bbb-3(b)(1), unless the authorization is terminated or revoked sooner.    Influenza A by PCR NEGATIVE NEGATIVE Final   Influenza B by PCR NEGATIVE NEGATIVE Final    Comment: (NOTE) The Xpert Xpress SARS-CoV-2/FLU/RSV assay is intended as an aid in  the diagnosis of influenza from Nasopharyngeal swab specimens and  should not be used as a sole basis for treatment. Nasal washings and  aspirates  are unacceptable for Xpert Xpress SARS-CoV-2/FLU/RSV  testing. Fact Sheet for Patients: https://www.moore.com/ Fact Sheet for Healthcare Providers: https://www.young.biz/ This test is not yet approved or cleared by the [place] FDA and  has been authorized for detection and/or diagnosis of SARS-CoV-2 by  FDA under an Emergency Use Authorization (EUA). This EUA will remain  in effect (meaning this test can be used) for the duration of the  Covid-19 declaration under Section 564(b)(1) of the Act, 21  U.S.C. section 360bbb-3(b)(1), unless the authorization is  terminated or revoked. Performed at [facility], [address] [place], [place] [address]          Radiology Studies: DG Wrist Complete Left  Result Date: 12/06/2019 CLINICAL DATA:  Fall through glass table EXAM: LEFT WRIST - COMPLETE 3+ VIEW COMPARISON:  None. FINDINGS: Laceration of the volar surface of the left wrist. No osseous abnormality. Small radiopaque foreign body at the proximal aspect of the laceration, visible only on the lateral projection. IMPRESSION: Laceration of the volar surface of the left  wrist with small radiopaque foreign body at the proximal aspect of the laceration, visible only on the lateral projection. Electronically Signed   By: Deatra Robinson M.D.   On: 12/06/2019 06:42   US RENAL  Result Date: 12/06/2019 CLINICAL DATA:  Acute kidney injury, history asthma, drug abuse, hypertension, smoker EXAM: RENAL / URINARY TRACT ULTRASOUND COMPLETE COMPARISON:  CT abdomen and pelvis 02/04/2019 FINDINGS: Right Kidney: Renal measurements: 11.7 x 6.8 x 6.1 cm = volume: 257 mL. Normal cortical thickness and echogenicity. Suboptimal visualization due to body habitus. No evidence of renal mass or hydronephrosis. Left Kidney: Renal measurements: 11.4 x 7.2 x 7.3 cm = volume: 318 mL. Suboptimally visualized due to body habitus. Grossly normal cortical thickness and  echogenicity. No mass or hydronephrosis. Bladder: Contains minimal urine, suboptimally evaluated. Other: N/A IMPRESSION: No renal sonographic abnormalities identified. Electronically Signed   By: Ulyses Southward M.D.   On: 12/06/2019 09:56   DG Knee Complete 4 Views Left  Result Date: 12/06/2019 CLINICAL DATA:  Fall through glass table EXAM: LEFT KNEE - COMPLETE 4+ VIEW COMPARISON:  None. FINDINGS: No evidence of fracture, dislocation, or joint effusion. No evidence of arthropathy or other focal bone abnormality. Soft tissues are unremarkable. IMPRESSION: Negative. Electronically Signed   By: Deatra Robinson M.D.   On: 12/06/2019 06:38   ECHOCARDIOGRAM COMPLETE  Result Date: 12/07/2019    ECHOCARDIOGRAM REPORT   Patient Name:   Joseph [name] Date of Exam: 12/06/2019 Medical Rec #:  [id number]        Height:       75.0 in Accession #:    [id number]       Weight:       322.0 lb Date of Birth:  [birth year]         BSA:          2.687 m Patient Age:    38 years         BP:           106/80 mmHg Patient Gender: M                HR:           96 bpm. Exam Location:  [facility] Procedure: 2D Echo and Intracardiac Opacification Agent Indications:     Syncope 780.2/ R55  History:         Patient has no prior history of Echocardiogram examinations.  Sonographer:     Wonda Cerise RDCS Referring Phys:  [id number] AGBATA Diagnosing Phys: Harold Hedge MD  Sonographer Comments: Technically challenging study due to limited acoustic windows, Technically difficult study due to poor echo windows, suboptimal subcostal window and patient is morbidly obese. Image acquisition challenging due to patient body habitus. IMPRESSIONS  1. Left ventricular ejection fraction, by estimation, is 65 to 70%. The left ventricle has normal function. The left ventricle has no regional wall motion abnormalities. There is mild left ventricular hypertrophy. Left ventricular diastolic parameters are consistent with Grade I diastolic dysfunction (impaired  relaxation).  2. Right ventricular systolic function is normal. The right ventricular size is normal.  3. Left atrial size was mildly dilated.  4. The mitral valve was not well visualized. Trivial mitral valve regurgitation.  5. The aortic valve was not well visualized. Aortic valve regurgitation is not visualized. FINDINGS  Left Ventricle: Left ventricular ejection fraction, by estimation, is 65 to 70%. The left ventricle has normal function. The left ventricle has no regional wall motion abnormalities. Definity contrast agent  was given IV to delineate the left ventricular  endocardial borders. The left ventricular internal cavity size was normal in size. There is mild left ventricular hypertrophy. Left ventricular diastolic parameters are consistent with Grade I diastolic dysfunction (impaired relaxation). Right Ventricle: The right ventricular size is normal. No increase in right ventricular wall thickness. Right ventricular systolic function is normal. Left Atrium: Left atrial size was mildly dilated. Right Atrium: Right atrial size was normal in size. Pericardium: There is no evidence of pericardial effusion. Mitral Valve: The mitral valve was not well visualized. Trivial mitral valve regurgitation. Tricuspid Valve: The tricuspid valve is not well visualized. Tricuspid valve regurgitation is trivial. Aortic Valve: The aortic valve was not well visualized. Aortic valve regurgitation is not visualized. Aortic valve peak gradient measures 14.0 mmHg. Pulmonic Valve: The pulmonic valve was not well visualized. Pulmonic valve regurgitation is not visualized. Aorta: The aortic root was not well visualized. IAS/Shunts: The interatrial septum was not assessed.  LEFT VENTRICLE PLAX 2D LVIDd:         4.47 cm  Diastology LVIDs:         2.75 cm  LV e' lateral:   14.30 cm/s LV PW:         1.66 cm  LV E/e' lateral: 5.1 LV IVS:        1.38 cm  LV e' medial:    8.05 cm/s LVOT diam:     2.30 cm  LV E/e' medial:  9.0 LV SV:          138 LV SV Index:   51 LVOT Area:     4.15 cm  LEFT ATRIUM           Index LA diam:      4.20 cm 1.56 cm/m LA Vol (A4C): 56.0 ml 20.84 ml/m  AORTIC VALVE                 PULMONIC VALVE AV Area (Vmax): 3.51 cm     PV Vmax:       1.05 m/s AV Vmax:        187.00 cm/s  PV Peak grad:  4.4 mmHg AV Peak Grad:   14.0 mmHg LVOT Vmax:      158.00 cm/s LVOT Vmean:     109.000 cm/s LVOT VTI:       0.333 m  AORTA Ao Root diam: 3.00 cm Ao Asc diam:  3.20 cm MITRAL VALVE MV Area (PHT): 8.25 cm    SHUNTS MV Decel Time: 92 msec     Systemic VTI:  0.33 m MV E velocity: 72.80 cm/s  Systemic Diam: 2.30 cm MV A velocity: 86.10 cm/s MV E/A ratio:  0.85 Harold HedgeKenneth Fath MD Electronically signed by Harold HedgeKenneth Fath MD Signature Date/Time: 12/07/2019/7:22:35 AM    Final         Scheduled Meds: . amphetamine-dextroamphetamine  10 mg Oral BID  . Buprenorphine HCl-Naloxone HCl  1 Film Sublingual TID  . gabapentin  600 mg Oral BID   And  . gabapentin  300 mg Oral QHS  . heparin  5,000 Units Subcutaneous Q8H  . levETIRAcetam  500 mg Oral BID  . levothyroxine  100 mcg Oral Q0600  . nicotine  21 mg Transdermal Daily   Continuous Infusions: . sodium chloride 200 mL/hr at 12/07/19 0504    Assessment & Plan:   Principal Problem:   AKI (acute kidney injury) [facility](HCC) Active Problems:   Seizure (HCC)   HTN (hypertension)   Thyroid disorder  Near syncope   Acute kidney injury  Most likely medication induced, patient was on lisinopril, Dyazide and nonsteroidal anti-inflammatory agents plus prerenal /dehydration.Meds were which will be discontinued Aggressive IV fluid hydration renal ultrasound negative Was normal Nephrology following    Near syncope Unclear etiology-possibly 2/2 AKI/hypotension Orthostatic blood pressure check Echo with normal EF Cardiac monitoring to rule out arrhythmias   Seizure disorder Continue Keppra Seizure precautions   Hypertension Will place patient on IV hydralazine for SBP  > 1100mmHg   Hypothyroidism Stable Continue Synthroid  Lacerated Wrist- Spoke to Dr. Raliegh Scarlet ortho about starting pt on abx, he is agreeable. Will start iv cefazolin and will need outpt hand surgeon referal as outpt.    DVT prophylaxis: Heparin Code Status: Full Family Communication:  None at bedside Disposition Plan: Back to previous home environment Barrier: needs ivf for AKI, hypotension.       LOS: 1 day   Time spent: 45 minutes with more than 50% COC    Nolberto Hanlon, MD [facility] Pager [phone]  If 7PM-7AM, please contact night-coverage [email or web address] Password [email or web address] 12/07/2019, 8:40 AM

## 2019-12-07 NOTE — Progress Notes (Signed)
[address] [place], [place] [address] Phone [phone]. Fax [phone]  Date: 12/07/2019                  Patient Name:  Joseph [name]  MRN: [id number]  DOB: 21-Jun-1981  Age / Sex: 39 y.o., male         PCP: [facility]                 Service Requesting Consult: IM/ Lynn Ito, MD                 Reason for Consult: ARF            History of Present Illness: Patient is a 39 y.o. male with medical problems of substance abuse, Hypertension, ADHD, thyroid disorder, anxiety, history of seizures who was admitted to [facility] on 12/06/2019 for evaluation of AKI (acute kidney injury) (HCC) [N17.9] Laceration of left wrist, initial encounter [S61.512A]  Today, patient is back to normal mental status Serum creatinine has improved significantly His urine output also has improved significantly Able to eat without nausea or vomiting No shortness of breath No leg edema Patient states he was trying to lose weight therefore not eating and drinking as much    Current medications: Current Facility-Administered Medications  Medication Dose Route Frequency Provider Last Rate Last Admin  . 0.9 %  sodium chloride infusion   Intravenous Continuous Agbata, Tochukwu, MD 200 mL/hr at 12/07/19 1000 Rate Verify at 12/07/19 1000  . acetaminophen (TYLENOL) tablet 650 mg  650 mg Oral Q6H PRN Agbata, Tochukwu, MD   650 mg at 12/06/19 2326   Or  . acetaminophen (TYLENOL) suppository 650 mg  650 mg Rectal Q6H PRN Agbata, Tochukwu, MD      . amphetamine-dextroamphetamine (ADDERALL) tablet 10 mg  10 mg Oral BID Agbata, Tochukwu, MD   10 mg at 12/07/19 0930  . Buprenorphine HCl-Naloxone HCl 8-2 MG FILM 1 Film  1 Film Sublingual TID Lucile Shutters, MD   1 Film at 12/07/19 0953  . gabapentin (NEURONTIN) capsule 600 mg  600 mg Oral BID Agbata, Tochukwu, MD   600 mg at 12/07/19 0931   And  . gabapentin (NEURONTIN) capsule 300 mg  300 mg Oral QHS Agbata, Tochukwu, MD   300 mg at  12/06/19 2129  . heparin injection 5,000 Units  5,000 Units Subcutaneous Q8H Agbata, Tochukwu, MD   5,000 Units at 12/07/19 0501  . hydrALAZINE (APRESOLINE) injection 10 mg  10 mg Intravenous Q6H PRN Agbata, Tochukwu, MD      . levETIRAcetam (KEPPRA) tablet 500 mg  500 mg Oral BID Agbata, Tochukwu, MD   500 mg at 12/07/19 0930  . levothyroxine (SYNTHROID) tablet 100 mcg  100 mcg Oral Q0600 Agbata, Tochukwu, MD   100 mcg at 12/07/19 0501  . nicotine (NICODERM CQ - dosed in mg/24 hours) patch 21 mg  21 mg Transdermal Daily Agbata, Tochukwu, MD   21 mg at 12/07/19 0934  . ondansetron (ZOFRAN) tablet 4 mg  4 mg Oral Q6H PRN Agbata, Tochukwu, MD       Or  . ondansetron (ZOFRAN) injection 4 mg  4 mg Intravenous Q6H PRN Agbata, Tochukwu, MD         Vital Signs: Blood pressure (!) 100/54, pulse 68, temperature 98.1 F (36.7 C), temperature source Oral, resp. rate 16, height 6\' 3"  (1.905 m), weight (!) 146.1 kg, SpO2 100 %.   Intake/Output Summary (Last 24 hours) at 12/07/2019 1127 Last data  filed at 12/07/2019 1000 Gross per 24 hour  Intake 4172.09 ml  Output 2800 ml  Net 1372.09 ml    Weight trends: [employer]   12/06/19 0432 12/06/19 0442  Weight: (!) 146.1 kg (!) 146.1 kg    Physical Exam: General:  Ill-appearing, laying in the bed  HEENT  dry oral mucous membranes  Neck:  Short, thick  Lungs:  Normal breathing effort, room air  Heart::  No rub  Abdomen:  Soft, nontender, bowel sounds are present  Extremities:  No edema, Left wrist laceration, sutured  Neurologic:  Alert and oriented  Skin:  Normal turgor, no acute rashes    Lab results: Basic Metabolic Panel: Recent Labs  Lab 12/06/19 0527 12/06/19 1415  NA 137 137  K 3.6 4.4  CL 98 102  CO2 27 28  GLUCOSE 104* 112*  BUN 40* 43*  CREATININE 4.25* 3.27*  CALCIUM 8.9 8.3*  PHOS  --  4.4    Liver Function Tests: Recent Labs  Lab 12/06/19 0527 12/06/19 0527 12/06/19 1415  AST 68*  --   --   ALT 38  --   --    ALKPHOS 82  --   --   BILITOT 0.8  --   --   PROT 8.0  --   --   ALBUMIN 4.6   < > 4.0   < > = values in this interval not displayed.   No results for input(s): LIPASE, AMYLASE in the last 168 hours. No results for input(s): AMMONIA in the last 168 hours.  CBC: Recent Labs  Lab 12/06/19 0527 12/07/19 0605  WBC 13.2* 7.5  HGB 16.0 14.0  HCT 47.3 42.9  MCV 84.2 87.2  PLT 348 287    Cardiac Enzymes: No results for input(s): CKTOTAL, TROPONINI in the last 168 hours.  BNP: Invalid input(s): POCBNP  CBG: No results for input(s): GLUCAP in the last 168 hours.  Microbiology: Recent Results (from the past 720 hour(s))  Respiratory Panel by RT PCR (Flu A&B, Covid) - Nasopharyngeal Swab     Status: None   Collection Time: 12/06/19  9:05 AM   Specimen: Nasopharyngeal Swab  Result Value Ref Range Status   SARS Coronavirus 2 by RT PCR NEGATIVE NEGATIVE Final    Comment: (NOTE) SARS-CoV-2 target nucleic acids are NOT DETECTED. The SARS-CoV-2 RNA is generally detectable in upper respiratoy specimens during the acute phase of infection. The lowest concentration of SARS-CoV-2 viral copies this assay can detect is 131 copies/mL. A negative result does not preclude SARS-Cov-2 infection and should not be used as the sole basis for treatment or other patient management decisions. A negative result may occur with  improper specimen collection/handling, submission of specimen other than nasopharyngeal swab, presence of viral mutation(s) within the areas targeted by this assay, and inadequate number of viral copies (<131 copies/mL). A negative result must be combined with clinical observations, patient history, and epidemiological information. The expected result is Negative. Fact Sheet for Patients:  https://www.moore.com/https://www.fda.gov/media/142436/download Fact Sheet for Healthcare Providers:  https://www.young.biz/https://www.fda.gov/media/142435/download This test is not yet ap proved or cleared by the [place]  FDA and  has been authorized for detection and/or diagnosis of SARS-CoV-2 by FDA under an Emergency Use Authorization (EUA). This EUA will remain  in effect (meaning this test can be used) for the duration of the COVID-19 declaration under Section 564(b)(1) of the Act, 21 U.S.C. section 360bbb-3(b)(1), unless the authorization is terminated or revoked sooner.    Influenza A by  PCR NEGATIVE NEGATIVE Final   Influenza B by PCR NEGATIVE NEGATIVE Final    Comment: (NOTE) The Xpert Xpress SARS-CoV-2/FLU/RSV assay is intended as an aid in  the diagnosis of influenza from Nasopharyngeal swab specimens and  should not be used as a sole basis for treatment. Nasal washings and  aspirates are unacceptable for Xpert Xpress SARS-CoV-2/FLU/RSV  testing. Fact Sheet for Patients: https://www.moore.com/ Fact Sheet for Healthcare Providers: https://www.young.biz/ This test is not yet approved or cleared by the [place] FDA and  has been authorized for detection and/or diagnosis of SARS-CoV-2 by  FDA under an Emergency Use Authorization (EUA). This EUA will remain  in effect (meaning this test can be used) for the duration of the  Covid-19 declaration under Section 564(b)(1) of the Act, 21  U.S.C. section 360bbb-3(b)(1), unless the authorization is  terminated or revoked. Performed at [facility], [address] [place], [place] [address]      Coagulation Studies: No results for input(s): LABPROT, INR in the last 72 hours.  Urinalysis: Recent Labs    12/06/19 1349  COLORURINE YELLOW  LABSPEC 1.020  PHURINE 5.5  GLUCOSEU NEGATIVE  HGBUR SMALL*  BILIRUBINUR NEGATIVE  KETONESUR NEGATIVE  PROTEINUR 100*  NITRITE NEGATIVE  LEUKOCYTESUR NEGATIVE        Imaging: DG Wrist Complete Left  Result Date: 12/06/2019 CLINICAL DATA:  Fall through glass table EXAM: LEFT WRIST - COMPLETE 3+ VIEW COMPARISON:  None. FINDINGS: Laceration of the  volar surface of the left wrist. No osseous abnormality. Small radiopaque foreign body at the proximal aspect of the laceration, visible only on the lateral projection. IMPRESSION: Laceration of the volar surface of the left wrist with small radiopaque foreign body at the proximal aspect of the laceration, visible only on the lateral projection. Electronically Signed   By: Deatra Robinson M.D.   On: 12/06/2019 06:42   US RENAL  Result Date: 12/06/2019 CLINICAL DATA:  Acute kidney injury, history asthma, drug abuse, hypertension, smoker EXAM: RENAL / URINARY TRACT ULTRASOUND COMPLETE COMPARISON:  CT abdomen and pelvis 02/04/2019 FINDINGS: Right Kidney: Renal measurements: 11.7 x 6.8 x 6.1 cm = volume: 257 mL. Normal cortical thickness and echogenicity. Suboptimal visualization due to body habitus. No evidence of renal mass or hydronephrosis. Left Kidney: Renal measurements: 11.4 x 7.2 x 7.3 cm = volume: 318 mL. Suboptimally visualized due to body habitus. Grossly normal cortical thickness and echogenicity. No mass or hydronephrosis. Bladder: Contains minimal urine, suboptimally evaluated. Other: N/A IMPRESSION: No renal sonographic abnormalities identified. Electronically Signed   By: Ulyses Southward M.D.   On: 12/06/2019 09:56   DG Knee Complete 4 Views Left  Result Date: 12/06/2019 CLINICAL DATA:  Fall through glass table EXAM: LEFT KNEE - COMPLETE 4+ VIEW COMPARISON:  None. FINDINGS: No evidence of fracture, dislocation, or joint effusion. No evidence of arthropathy or other focal bone abnormality. Soft tissues are unremarkable. IMPRESSION: Negative. Electronically Signed   By: Deatra Robinson M.D.   On: 12/06/2019 06:38   ECHOCARDIOGRAM COMPLETE  Result Date: 12/07/2019    ECHOCARDIOGRAM REPORT   Patient Name:   Joseph [name] Date of Exam: 12/06/2019 Medical Rec #:  [id number]        Height:       75.0 in Accession #:    [id number]       Weight:       322.0 lb Date of Birth:  07-28-1981         BSA:  2.687  m Patient Age:    38 years         BP:           106/80 mmHg Patient Gender: M                HR:           96 bpm. Exam Location:  [facility] Procedure: 2D Echo and Intracardiac Opacification Agent Indications:     Syncope 780.2/ R55  History:         Patient has no prior history of Echocardiogram examinations.  Sonographer:     Wonda Cerise RDCS Referring Phys:  [id number] OZDGUYQI AGBATA Diagnosing Phys: Harold Hedge MD  Sonographer Comments: Technically challenging study due to limited acoustic windows, Technically difficult study due to poor echo windows, suboptimal subcostal window and patient is morbidly obese. Image acquisition challenging due to patient body habitus. IMPRESSIONS  1. Left ventricular ejection fraction, by estimation, is 65 to 70%. The left ventricle has normal function. The left ventricle has no regional wall motion abnormalities. There is mild left ventricular hypertrophy. Left ventricular diastolic parameters are consistent with Grade I diastolic dysfunction (impaired relaxation).  2. Right ventricular systolic function is normal. The right ventricular size is normal.  3. Left atrial size was mildly dilated.  4. The mitral valve was not well visualized. Trivial mitral valve regurgitation.  5. The aortic valve was not well visualized. Aortic valve regurgitation is not visualized. FINDINGS  Left Ventricle: Left ventricular ejection fraction, by estimation, is 65 to 70%. The left ventricle has normal function. The left ventricle has no regional wall motion abnormalities. Definity contrast agent was given IV to delineate the left ventricular  endocardial borders. The left ventricular internal cavity size was normal in size. There is mild left ventricular hypertrophy. Left ventricular diastolic parameters are consistent with Grade I diastolic dysfunction (impaired relaxation). Right Ventricle: The right ventricular size is normal. No increase in right ventricular wall thickness. Right ventricular  systolic function is normal. Left Atrium: Left atrial size was mildly dilated. Right Atrium: Right atrial size was normal in size. Pericardium: There is no evidence of pericardial effusion. Mitral Valve: The mitral valve was not well visualized. Trivial mitral valve regurgitation. Tricuspid Valve: The tricuspid valve is not well visualized. Tricuspid valve regurgitation is trivial. Aortic Valve: The aortic valve was not well visualized. Aortic valve regurgitation is not visualized. Aortic valve peak gradient measures 14.0 mmHg. Pulmonic Valve: The pulmonic valve was not well visualized. Pulmonic valve regurgitation is not visualized. Aorta: The aortic root was not well visualized. IAS/Shunts: The interatrial septum was not assessed.  LEFT VENTRICLE PLAX 2D LVIDd:         4.47 cm  Diastology LVIDs:         2.75 cm  LV e' lateral:   14.30 cm/s LV PW:         1.66 cm  LV E/e' lateral: 5.1 LV IVS:        1.38 cm  LV e' medial:    8.05 cm/s LVOT diam:     2.30 cm  LV E/e' medial:  9.0 LV SV:         138 LV SV Index:   51 LVOT Area:     4.15 cm  LEFT ATRIUM           Index LA diam:      4.20 cm 1.56 cm/m LA Vol (A4C): 56.0 ml 20.84 ml/m  AORTIC VALVE  PULMONIC VALVE AV Area (Vmax): 3.51 cm     PV Vmax:       1.05 m/s AV Vmax:        187.00 cm/s  PV Peak grad:  4.4 mmHg AV Peak Grad:   14.0 mmHg LVOT Vmax:      158.00 cm/s LVOT Vmean:     109.000 cm/s LVOT VTI:       0.333 m  AORTA Ao Root diam: 3.00 cm Ao Asc diam:  3.20 cm MITRAL VALVE MV Area (PHT): 8.25 cm    SHUNTS MV Decel Time: 92 msec     Systemic VTI:  0.33 m MV E velocity: 72.80 cm/s  Systemic Diam: 2.30 cm MV A velocity: 86.10 cm/s MV E/A ratio:  0.85 Bartholome Bill MD Electronically signed by Bartholome Bill MD Signature Date/Time: 12/07/2019/7:22:35 AM    Final      Assessment & Plan: Pt is a 39 y.o. Caucasian  male withmedical problems of substance abuse, Hypertension, ADHD, thyroid disorder, anxiety, history of seizures , was admitted on  12/06/2019 with AKI (acute kidney injury) ([place]) [N17.9] Laceration of left wrist, initial encounter [S61.512A]  #Acute kidney injury #Proteinuria #Hypertension  Baseline creatinine 0.8 from Dec 19, 2018 Work-up so far includes a renal ultrasound which is unremarkable negative for obstruction Patient was hypotensive upon arrival suggesting possibility of prerenal/ATN related AKI Urinalysis suggest proteinuria, 0-5 RBCs, 0-5 WBCs Home medications include furosemide, BCs, lisinopril, triamterene/HCTZ Would hold all the above Serum creatinine has improved significantly with IV hydration Would use diuretics only as needed as outpatient Discussed with patient to monitor blood pressure at home May resume lisinopril once systolic blood pressure is consistently greater than 140 Avoid nonsteroidals/BC powders  Lab Results  Component Value Date   CREATININE 3.27 (H) 12/06/2019   CREATININE 4.25 (H) 12/06/2019   CREATININE 1.46 (H) 11/05/2016         LOS: 1 Goldye Tourangeau 5/3/202111:27 AM    Note: This note was prepared with Dragon dictation. Any transcription errors are unintentional

## 2019-12-07 NOTE — Consult Note (Signed)
ORTHOPAEDIC CONSULTATION  REQUESTING PHYSICIAN: Lynn Ito, MD  Chief Complaint:   Left wrist laceration.  History of Present Illness: Joseph [name] is a 39 y.o. male with a history of obesity, asthma, hypertension, and drug abuse who apparently was having difficulties passing out on Saturday evening.  He states that every time he tried to stand up, he would become lightheaded and fall.  As he struck the ground, it would jar him to consciousness.  However, the last time this happened, he put his hand through a glass tabletop, lacerating his left wrist.  He presented to the emergency room where the ER physician assessed his wound and saw a lacerated tendon at the depth of the wound.  He also felt that the patient has slightly decreased sensation to light touch to the median nerve distribution of his hand.  He contacted a Hydrographic surveyor in [place] who advised him to irrigate and close the wound, which he did.  The patient subsequently was admitted to the hospitalist service for further work-up of his passing out episodes.  I have been asked to see the patient while in the hospital to reassess his neurovascular and tendon status.  The patient denies any numbness or paresthesias to his fingertips on today's visit.  Past Medical History:  Diagnosis Date  . Asthma   . Drug abuse (HCC)   . Hypertension    Past Surgical History:  Procedure Laterality Date  . APPENDECTOMY    . BACK SURGERY    . CHOLECYSTECTOMY     Social History   Socioeconomic History  . Marital status: Single    Spouse name: Not on file  . Number of children: Not on file  . Years of education: Not on file  . Highest education level: Not on file  Occupational History  . Not on file  Tobacco Use  . Smoking status: Current Every Day Smoker    Packs/day: 0.50    Years: 15.00    Pack years: 7.50    Types: Cigarettes  . Smokeless tobacco: Never Used   Substance and Sexual Activity  . Alcohol use: No  . Drug use: Yes    Types: Cocaine, Marijuana, IV    Comment: Percocets, sniffs heroin  . Sexual activity: Not on file  Other Topics Concern  . Not on file  Social History Narrative  . Not on file   Social Determinants of Health   Financial Resource Strain:   . Difficulty of Paying Living Expenses:   Food Insecurity:   . Worried About Programme researcher, broadcasting/film/video in the Last Year:   . Barista in the Last Year:   Transportation Needs:   . Freight forwarder (Medical):   Marland Kitchen Lack of Transportation (Non-Medical):   Physical Activity:   . Days of Exercise per Week:   . Minutes of Exercise per Session:   Stress:   . Feeling of Stress :   Social Connections:   . Frequency of Communication with Friends and Family:   . Frequency of Social Gatherings with Friends and Family:   . Attends Religious Services:   . Active Member of Clubs or Organizations:   . Attends Banker Meetings:   Marland Kitchen Marital Status:    Family History  Problem Relation Age of Onset  . Hypertension Other    No Known Allergies Prior to Admission medications   Medication Sig Start Date End Date Taking? Authorizing Provider  amphetamine-dextroamphetamine (ADDERALL) 10 MG tablet Take 10  mg by mouth 2 (two) times daily. 12/03/19  Yes [provider]  furosemide (LASIX) 40 MG tablet Take by mouth. 12/19/18  Yes [provider]  gabapentin (NEURONTIN) 300 MG capsule Take 300-600 mg by mouth 3 (three) times daily. Two caps qam, 2 caps in the afternoon, and 1 qhs   Yes [provider]  SUBOXONE 8-2 MG FILM Place 1 Film under the tongue in the morning and at bedtime.    Yes [provider]  Aspirin-Salicylamide-Caffeine (BC HEADACHE POWDER PO) Take 1 packet by mouth daily as needed.    [provider]  levETIRAcetam (KEPPRA) 500 MG tablet Take 1 tablet (500 mg total) by mouth 2 (two) times daily. Patient not taking:  Reported on 09/08/2016 04/29/16   Arnaldo Natal, MD  levothyroxine (SYNTHROID) 100 MCG tablet Take by mouth. 01/28/19   [provider]  lisinopril (PRINIVIL,ZESTRIL) 20 MG tablet Take 1 tablet by mouth daily.    [provider]  naloxone [facility]) nasal spray 4 mg/0.1 mL Use in the event of suspected overdose of heroin or opiates 11/05/16   Willy Eddy, MD  triamterene-hydrochlorothiazide (MAXZIDE-25) 37.5-25 MG tablet Take 1 tablet by mouth daily.     [provider]   DG Wrist Complete Left  Result Date: 12/06/2019 CLINICAL DATA:  Fall through glass table EXAM: LEFT WRIST - COMPLETE 3+ VIEW COMPARISON:  None. FINDINGS: Laceration of the volar surface of the left wrist. No osseous abnormality. Small radiopaque foreign body at the proximal aspect of the laceration, visible only on the lateral projection. IMPRESSION: Laceration of the volar surface of the left wrist with small radiopaque foreign body at the proximal aspect of the laceration, visible only on the lateral projection. Electronically Signed   By: Deatra Robinson M.D.   On: 12/06/2019 06:42   US RENAL  Result Date: 12/06/2019 CLINICAL DATA:  Acute kidney injury, history asthma, drug abuse, hypertension, smoker EXAM: RENAL / URINARY TRACT ULTRASOUND COMPLETE COMPARISON:  CT abdomen and pelvis 02/04/2019 FINDINGS: Right Kidney: Renal measurements: 11.7 x 6.8 x 6.1 cm = volume: 257 mL. Normal cortical thickness and echogenicity. Suboptimal visualization due to body habitus. No evidence of renal mass or hydronephrosis. Left Kidney: Renal measurements: 11.4 x 7.2 x 7.3 cm = volume: 318 mL. Suboptimally visualized due to body habitus. Grossly normal cortical thickness and echogenicity. No mass or hydronephrosis. Bladder: Contains minimal urine, suboptimally evaluated. Other: N/A IMPRESSION: No renal sonographic abnormalities identified. Electronically Signed   By: Ulyses Southward M.D.   On: 12/06/2019 09:56   DG Knee Complete 4  Views Left  Result Date: 12/06/2019 CLINICAL DATA:  Fall through glass table EXAM: LEFT KNEE - COMPLETE 4+ VIEW COMPARISON:  None. FINDINGS: No evidence of fracture, dislocation, or joint effusion. No evidence of arthropathy or other focal bone abnormality. Soft tissues are unremarkable. IMPRESSION: Negative. Electronically Signed   By: Deatra Robinson M.D.   On: 12/06/2019 06:38   ECHOCARDIOGRAM COMPLETE  Result Date: 12/07/2019    ECHOCARDIOGRAM REPORT   Patient Name:   CUAUHTEMOC HUEGEL Date of Exam: 12/06/2019 Medical Rec #:  [id number]        Height:       75.0 in Accession #:    [id number]       Weight:       322.0 lb Date of Birth:  04/29/1981         BSA:          2.687 m Patient Age:  38 years         BP:           106/80 mmHg Patient Gender: M                HR:           96 bpm. Exam Location:  [facility] Procedure: 2D Echo and Intracardiac Opacification Agent Indications:     Syncope 780.2/ R55  History:         Patient has no prior history of Echocardiogram examinations.  Sonographer:     Wonda Cerise RDCS Referring Phys:  [id number] AGBATA Diagnosing Phys: Harold Hedge MD  Sonographer Comments: Technically challenging study due to limited acoustic windows, Technically difficult study due to poor echo windows, suboptimal subcostal window and patient is morbidly obese. Image acquisition challenging due to patient body habitus. IMPRESSIONS  1. Left ventricular ejection fraction, by estimation, is 65 to 70%. The left ventricle has normal function. The left ventricle has no regional wall motion abnormalities. There is mild left ventricular hypertrophy. Left ventricular diastolic parameters are consistent with Grade I diastolic dysfunction (impaired relaxation).  2. Right ventricular systolic function is normal. The right ventricular size is normal.  3. Left atrial size was mildly dilated.  4. The mitral valve was not well visualized. Trivial mitral valve regurgitation.  5. The aortic valve was not well  visualized. Aortic valve regurgitation is not visualized. FINDINGS  Left Ventricle: Left ventricular ejection fraction, by estimation, is 65 to 70%. The left ventricle has normal function. The left ventricle has no regional wall motion abnormalities. Definity contrast agent was given IV to delineate the left ventricular  endocardial borders. The left ventricular internal cavity size was normal in size. There is mild left ventricular hypertrophy. Left ventricular diastolic parameters are consistent with Grade I diastolic dysfunction (impaired relaxation). Right Ventricle: The right ventricular size is normal. No increase in right ventricular wall thickness. Right ventricular systolic function is normal. Left Atrium: Left atrial size was mildly dilated. Right Atrium: Right atrial size was normal in size. Pericardium: There is no evidence of pericardial effusion. Mitral Valve: The mitral valve was not well visualized. Trivial mitral valve regurgitation. Tricuspid Valve: The tricuspid valve is not well visualized. Tricuspid valve regurgitation is trivial. Aortic Valve: The aortic valve was not well visualized. Aortic valve regurgitation is not visualized. Aortic valve peak gradient measures 14.0 mmHg. Pulmonic Valve: The pulmonic valve was not well visualized. Pulmonic valve regurgitation is not visualized. Aorta: The aortic root was not well visualized. IAS/Shunts: The interatrial septum was not assessed.  LEFT VENTRICLE PLAX 2D LVIDd:         4.47 cm  Diastology LVIDs:         2.75 cm  LV e' lateral:   14.30 cm/s LV PW:         1.66 cm  LV E/e' lateral: 5.1 LV IVS:        1.38 cm  LV e' medial:    8.05 cm/s LVOT diam:     2.30 cm  LV E/e' medial:  9.0 LV SV:         138 LV SV Index:   51 LVOT Area:     4.15 cm  LEFT ATRIUM           Index LA diam:      4.20 cm 1.56 cm/m LA Vol (A4C): 56.0 ml 20.84 ml/m  AORTIC VALVE  PULMONIC VALVE AV Area (Vmax): 3.51 cm     PV Vmax:       1.05 m/s AV Vmax:         187.00 cm/s  PV Peak grad:  4.4 mmHg AV Peak Grad:   14.0 mmHg LVOT Vmax:      158.00 cm/s LVOT Vmean:     109.000 cm/s LVOT VTI:       0.333 m  AORTA Ao Root diam: 3.00 cm Ao Asc diam:  3.20 cm MITRAL VALVE MV Area (PHT): 8.25 cm    SHUNTS MV Decel Time: 92 msec     Systemic VTI:  0.33 m MV E velocity: 72.80 cm/s  Systemic Diam: 2.30 cm MV A velocity: 86.10 cm/s MV E/A ratio:  0.85 Harold HedgeKenneth Fath MD Electronically signed by Harold HedgeKenneth Fath MD Signature Date/Time: 12/07/2019/7:22:35 AM    Final     Positive ROS: All other systems have been reviewed and were otherwise negative with the exception of those mentioned in the HPI and as above.  Physical Exam: General:  Alert, no acute distress Psychiatric:  Patient is competent for consent with normal mood and affect   Cardiovascular:  No pedal edema Respiratory:  No wheezing, non-labored breathing GI:  Abdomen is soft and non-tender Skin:  No lesions in the area of chief complaint Neurologic:  Sensation intact distally Lymphatic:  No axillary or cervical lymphadenopathy  Orthopedic Exam:  Orthopedic examination is limited to the left forearm and hand.  There is a long transverse sutured laceration measuring approximately 5-6 cm situated approximately 1.5 to 2 cm proximal to the wrist flexor crease.  No swelling, erythema, ecchymosis, or active drainage is noted from the wound.  The patient is able to actively flex and extend the wrist, although wrist flexion is weak due to discomfort.  He also is able to actively flex and extend all digits without any pain or triggering.  Individual testing of the FDP and FDS tendons to each digit shows each to be intact, as is the flexor tendon to the thumb.  His thenar muscles also appear to be functioning well.  Sensation is intact to light touch to the radial ulnar tips of all digits, including the thumb.  He has good capillary refill to all digits.  X-rays:  AP, lateral, oblique views of the left wrist are available for  review and have been reviewed by myself.  These films demonstrate no evidence of fractures, lytic lesions, or significant degenerative changes of any of the joints.  There is a small radiopaque foreign body noted in the volar wrist soft tissues, probably consistent with a small retained glass fragment.  These images were taken prior to irrigation and closure of the wound.  Assessment: Transverse laceration to left wrist.  Plan: The treatment options have been discussed in detail with the patient.  Based on today's examination, it appears that his neurologic function is intact, as is his hand intrinsic and extrinsic function.  However, the ER physician described seeing a lacerated tendon in the wound prior to closure.  It is possible that this could be the palmaris longus tendon and therefore would not need to be repaired.  However, it is possible that it could be the flexor carpi radialis tendon in which case it might be advisable to be repaired.  Regardless, I feel that the patient should be assessed by a trained hand surgeon for consideration of wound exploration and repair of any structures that may have been damaged by his  fall once he is cleared for discharge from the hospital.  Options for such a hand surgeon include Dr. Peggye Ley at [facility] in [place], or Dr. Amedeo Plenty or one of his associates in [place].  Thank you for asked me to participate in the care of this unfortunate man.  Please let me know if I can be of any further assistance.   Pascal Lux, MD  Beeper #:  [phone]  12/07/2019 8:16 AM

## 2019-12-08 DIAGNOSIS — S61512A Laceration without foreign body of left wrist, initial encounter: Secondary | ICD-10-CM | POA: Insufficient documentation

## 2019-12-08 MED ORDER — ACETAMINOPHEN 325 MG PO TABS: 650.0000 mg | ORAL_TABLET | Freq: Four times a day (QID) | ORAL | Status: DC | PRN

## 2019-12-08 MED ORDER — CEPHALEXIN 500 MG PO CAPS: 500.0000 mg | ORAL_CAPSULE | Freq: Three times a day (TID) | ORAL | 0 refills | Status: AC

## 2019-12-08 MED ORDER — LEVETIRACETAM 500 MG PO TABS: 500.0000 mg | ORAL_TABLET | Freq: Two times a day (BID) | ORAL | 2 refills | Status: DC

## 2019-12-08 MED ORDER — LEVOTHYROXINE SODIUM 100 MCG PO TABS: 100.0000 ug | ORAL_TABLET | Freq: Every day | ORAL | Status: DC

## 2019-12-08 MED ORDER — CEPHALEXIN 500 MG PO CAPS
500.0000 mg | ORAL_CAPSULE | Freq: Three times a day (TID) | ORAL | Status: DC
  Administered 2019-12-08: 500 mg via ORAL
  Filled 2019-12-08: qty 1

## 2019-12-08 NOTE — Discharge Summary (Signed)
NORA ROOKE QBV:[id number] DOB: [birth year] DOA: 12/06/2019  PCP: [facility] date: 12/06/2019 Discharge date: 12/08/2019  Admitted From: Home Disposition: Home  Recommendations for Outpatient Follow-up:  1. Follow up with PCP in 1 week 2. Please obtain BMP/CBC in one week 3. Follow-up with orthopedics Dr. Jeannie Fend in [name]: None   Discharge Condition:Stable CODE STATUS: Full Diet recommendation: Heart Healthy Brief/Interim Summary: [name] is a 39 y.o. male with medical history significant for hypertension, obesity (BMI 40), asthma and substance abuse who presents to the emergency room for evaluation of dizziness.  Patient complained of feeling dizzy when he gets up and tries to walk around.  Because of the dizziness patient fell and crushed through a table with a glass and suffered a laceration of his left wrist.Labs revealed a serum creatinine of 4.25 when compared to a baseline of 1.46 from 2018 and elevated BUN of 40 compared to 15 from 3 years ago.X-ray of the left wrist showed laceration of the volar surface of the left wrist with small radiopaque foreign body at the proximal aspect of the laceration,visible only on the lateral projection. Patient's laceration was sutured in the emergency room.  Orthopedics Dr. Roland Rack was consulted and he recommended patient to follow-up with a hand surgeon Dr. Jeannie Fend in [place].  Patient was started on IV fluids.  Nephrology was consulted.  He had a renal ultrasound that was negative.  His home medications including lisinopril, BC powder, triamterene hydrochlorothiazide were all discontinued.  Pressure was low.  His toxicology screen was positive for amphetamines and cannabinoids.  Patient was counseled on cannabinoid use.  Once he was hydrated his creatinine back to baseline at 1.33.  He is stable to be discharged home.  I have given him a work note also.  He was sent out with antibiotics for his laceration  after discussed with orthopedics. Discharge Diagnoses:  Principal Problem:   AKI (acute kidney injury) ([name]) Active Problems:   Seizure ([name])   HTN (hypertension)   Thyroid disorder   Near syncope    Discharge Instructions  Discharge Instructions    Call MD for:  temperature >100.4   Complete by: As directed    Diet - low sodium heart healthy   Complete by: As directed    Increase activity slowly   Complete by: As directed      Allergies as of 12/08/2019   No Known Allergies     Medication List    STOP taking these medications   BC HEADACHE POWDER PO   lisinopril 20 MG tablet Commonly known as: ZESTRIL   triamterene-hydrochlorothiazide 37.5-25 MG capsule Commonly known as: DYAZIDE     TAKE these medications   acetaminophen 325 MG tablet Commonly known as: TYLENOL Take 2 tablets (650 mg total) by mouth every 6 (six) hours as needed for mild pain (or Fever >/= 101).   amphetamine-dextroamphetamine 10 MG tablet Commonly known as: ADDERALL Take 10 mg by mouth 2 (two) times daily.   cephALEXin 500 MG capsule Commonly known as: KEFLEX Take 1 capsule (500 mg total) by mouth every 8 (eight) hours for 5 days.   gabapentin 300 MG capsule Commonly known as: NEURONTIN Take 600 mg by mouth 3 (three) times daily.   levETIRAcetam 500 MG tablet Commonly known as: Keppra Take 1 tablet (500 mg total) by mouth 2 (two) times daily.   levothyroxine 100 MCG tablet Commonly known as: SYNTHROID Take 1 tablet (100 mcg total) by mouth daily  before breakfast. What changed:   how much to take  when to take this   naloxone 4 MG/0.1ML Liqd nasal spray kit Commonly known as: NARCAN Use in the event of suspected overdose of heroin or opiates   Suboxone 8-2 MG Film Generic drug: Buprenorphine HCl-Naloxone HCl Place 1 Film under the tongue in the morning and at bedtime.      Follow-up Information    Avanell Shackleton III, MD In 2 days.   Why: needs to be seen asap. left  message for them to call me back Contact information: [address] [name] [address] [phone]          No Known Allergies  Consultations:  Orthopedics, nephrology   Procedures/Studies: DG Wrist Complete Left  Result Date: 12/06/2019 CLINICAL DATA:  Fall through glass table EXAM: LEFT WRIST - COMPLETE 3+ VIEW COMPARISON:  None. FINDINGS: Laceration of the volar surface of the left wrist. No osseous abnormality. Small radiopaque foreign body at the proximal aspect of the laceration, visible only on the lateral projection. IMPRESSION: Laceration of the volar surface of the left wrist with small radiopaque foreign body at the proximal aspect of the laceration, visible only on the lateral projection. Electronically Signed   By: Ulyses Jarred M.D.   On: 12/06/2019 06:42   US RENAL  Result Date: 12/06/2019 CLINICAL DATA:  Acute kidney injury, history asthma, drug abuse, hypertension, smoker EXAM: RENAL / URINARY TRACT ULTRASOUND COMPLETE COMPARISON:  CT abdomen and pelvis 02/04/2019 FINDINGS: Right Kidney: Renal measurements: 11.7 x 6.8 x 6.1 cm = volume: 257 mL. Normal cortical thickness and echogenicity. Suboptimal visualization due to body habitus. No evidence of renal mass or hydronephrosis. Left Kidney: Renal measurements: 11.4 x 7.2 x 7.3 cm = volume: 318 mL. Suboptimally visualized due to body habitus. Grossly normal cortical thickness and echogenicity. No mass or hydronephrosis. Bladder: Contains minimal urine, suboptimally evaluated. Other: N/A IMPRESSION: No renal sonographic abnormalities identified. Electronically Signed   By: Lavonia Dana M.D.   On: 12/06/2019 09:56   DG Knee Complete 4 Views Left  Result Date: 12/06/2019 CLINICAL DATA:  Fall through glass table EXAM: LEFT KNEE - COMPLETE 4+ VIEW COMPARISON:  None. FINDINGS: No evidence of fracture, dislocation, or joint effusion. No evidence of arthropathy or other focal bone abnormality. Soft tissues are  unremarkable. IMPRESSION: Negative. Electronically Signed   By: Ulyses Jarred M.D.   On: 12/06/2019 06:38   ECHOCARDIOGRAM COMPLETE  Result Date: 12/07/2019    ECHOCARDIOGRAM REPORT   Patient Name:   [name] Date of Exam: 12/06/2019 Medical Rec #:  [id number]        Height:       75.0 in Accession #:    [id number]       Weight:       322.0 lb Date of Birth:  [birth year]         BSA:          2.687 m Patient Age:    44 years         BP:           106/80 mmHg Patient Gender: M                HR:           96 bpm. Exam Location:  [facility] Procedure: 2D Echo and Intracardiac Opacification Agent Indications:     Syncope 780.2/ R55  History:         Patient  has no prior history of Echocardiogram examinations.  Sonographer:     Arville Go RDCS Referring Phys:  [id number] AGBATA Diagnosing Phys: Bartholome Bill MD  Sonographer Comments: Technically challenging study due to limited acoustic windows, Technically difficult study due to poor echo windows, suboptimal subcostal window and patient is morbidly obese. Image acquisition challenging due to patient body habitus. IMPRESSIONS  1. Left ventricular ejection fraction, by estimation, is 65 to 70%. The left ventricle has normal function. The left ventricle has no regional wall motion abnormalities. There is mild left ventricular hypertrophy. Left ventricular diastolic parameters are consistent with Grade I diastolic dysfunction (impaired relaxation).  2. Right ventricular systolic function is normal. The right ventricular size is normal.  3. Left atrial size was mildly dilated.  4. The mitral valve was not well visualized. Trivial mitral valve regurgitation.  5. The aortic valve was not well visualized. Aortic valve regurgitation is not visualized. FINDINGS  Left Ventricle: Left ventricular ejection fraction, by estimation, is 65 to 70%. The left ventricle has normal function. The left ventricle has no regional wall motion abnormalities. Definity contrast agent was  given IV to delineate the left ventricular  endocardial borders. The left ventricular internal cavity size was normal in size. There is mild left ventricular hypertrophy. Left ventricular diastolic parameters are consistent with Grade I diastolic dysfunction (impaired relaxation). Right Ventricle: The right ventricular size is normal. No increase in right ventricular wall thickness. Right ventricular systolic function is normal. Left Atrium: Left atrial size was mildly dilated. Right Atrium: Right atrial size was normal in size. Pericardium: There is no evidence of pericardial effusion. Mitral Valve: The mitral valve was not well visualized. Trivial mitral valve regurgitation. Tricuspid Valve: The tricuspid valve is not well visualized. Tricuspid valve regurgitation is trivial. Aortic Valve: The aortic valve was not well visualized. Aortic valve regurgitation is not visualized. Aortic valve peak gradient measures 14.0 mmHg. Pulmonic Valve: The pulmonic valve was not well visualized. Pulmonic valve regurgitation is not visualized. Aorta: The aortic root was not well visualized. IAS/Shunts: The interatrial septum was not assessed.  LEFT VENTRICLE PLAX 2D LVIDd:         4.47 cm  Diastology LVIDs:         2.75 cm  LV e' lateral:   14.30 cm/s LV PW:         1.66 cm  LV E/e' lateral: 5.1 LV IVS:        1.38 cm  LV e' medial:    8.05 cm/s LVOT diam:     2.30 cm  LV E/e' medial:  9.0 LV SV:         138 LV SV Index:   51 LVOT Area:     4.15 cm  LEFT ATRIUM           Index LA diam:      4.20 cm 1.56 cm/m LA Vol (A4C): 56.0 ml 20.84 ml/m  AORTIC VALVE                 PULMONIC VALVE AV Area (Vmax): 3.51 cm     PV Vmax:       1.05 m/s AV Vmax:        187.00 cm/s  PV Peak grad:  4.4 mmHg AV Peak Grad:   14.0 mmHg LVOT Vmax:      158.00 cm/s LVOT Vmean:     109.000 cm/s LVOT VTI:       0.333 m  AORTA Ao Root diam: 3.00  cm Ao Asc diam:  3.20 cm MITRAL VALVE MV Area (PHT): 8.25 cm    SHUNTS MV Decel Time: 92 msec     Systemic  VTI:  0.33 m MV E velocity: 72.80 cm/s  Systemic Diam: 2.30 cm MV A velocity: 86.10 cm/s MV E/A ratio:  0.85 Bartholome Bill MD Electronically signed by Bartholome Bill MD Signature Date/Time: 12/07/2019/7:22:35 AM    Final        Subjective: Has no complaints  Discharge Exam: Vitals:   12/08/19 0621 12/08/19 1142  BP: 102/61 (!) 102/54  Pulse: 66 68  Resp: 20 16  Temp: 98.2 F (36.8 C) 98.4 F (36.9 C)  SpO2: 99% 95%   Vitals:   12/07/19 1257 12/07/19 2013 12/08/19 0621 12/08/19 1142  BP: (!) 119/57 (!) 108/54 102/61 (!) 102/54  Pulse: 86 74 66 68  Resp: 20 20 20 16   Temp: 98.2 F (36.8 C) 98.4 F (36.9 C) 98.2 F (36.8 C) 98.4 F (36.9 C)  TempSrc: Oral Oral Oral Oral  SpO2: 96% 96% 99% 95%  Weight:      Height:        General: Pt is alert, awake, not in acute distress Cardiovascular: RRR, S1/S2 +, no rubs, no gallops Respiratory: CTA bilaterally, no wheezing, no rhonchi Abdominal: Soft, NT, ND, bowel sounds + Extremities: no edema, no cyanosis.  Left wrist wrapped    The results of significant diagnostics from this hospitalization (including imaging, microbiology, ancillary and laboratory) are listed below for reference.     Microbiology: Recent Results (from the past 240 hour(s))  Respiratory Panel by RT PCR (Flu A&B, Covid) - Nasopharyngeal Swab     Status: None   Collection Time: 12/06/19  9:05 AM   Specimen: Nasopharyngeal Swab  Result Value Ref Range Status   SARS Coronavirus 2 by RT PCR NEGATIVE NEGATIVE Final    Comment: (NOTE) SARS-CoV-2 target nucleic acids are NOT DETECTED. The SARS-CoV-2 RNA is generally detectable in upper respiratoy specimens during the acute phase of infection. The lowest concentration of SARS-CoV-2 viral copies this assay can detect is 131 copies/mL. A negative result does not preclude SARS-Cov-2 infection and should not be used as the sole basis for treatment or other patient management decisions. A negative result may occur  with  improper specimen collection/handling, submission of specimen other than nasopharyngeal swab, presence of viral mutation(s) within the areas targeted by this assay, and inadequate number of viral copies (<131 copies/mL). A negative result must be combined with clinical observations, patient history, and epidemiological information. The expected result is Negative. Fact Sheet for Patients:  PinkCheek.be Fact Sheet for Healthcare Providers:  GravelBags.it This test is not yet ap proved or cleared by the [place] FDA and  has been authorized for detection and/or diagnosis of SARS-CoV-2 by FDA under an Emergency Use Authorization (EUA). This EUA will remain  in effect (meaning this test can be used) for the duration of the COVID-19 declaration under Section 564(b)(1) of the Act, 21 U.S.C. section 360bbb-3(b)(1), unless the authorization is terminated or revoked sooner.    Influenza A by PCR NEGATIVE NEGATIVE Final   Influenza B by PCR NEGATIVE NEGATIVE Final    Comment: (NOTE) The Xpert Xpress SARS-CoV-2/FLU/RSV assay is intended as an aid in  the diagnosis of influenza from Nasopharyngeal swab specimens and  should not be used as a sole basis for treatment. Nasal washings and  aspirates are unacceptable for Xpert Xpress SARS-CoV-2/FLU/RSV  testing. Fact Sheet for Patients: PinkCheek.be Fact  Sheet for Healthcare Providers: GravelBags.it This test is not yet approved or cleared by the [place] and  has been authorized for detection and/or diagnosis of SARS-CoV-2 by  FDA under an Emergency Use Authorization (EUA). This EUA will remain  in effect (meaning this test can be used) for the duration of the  Covid-19 declaration under Section 564(b)(1) of the Act, 21  U.S.C. section 360bbb-3(b)(1), unless the authorization is  terminated or revoked. Performed  at [facility], [address] [place], [place] [address]      Labs: BNP (last 3 results) No results for input(s): BNP in the last 8760 hours. Basic Metabolic Panel: Recent Labs  Lab 12/06/19 0527 12/06/19 1415 12/07/19 0605  NA 137 137 137  K 3.6 4.4 4.3  CL 98 102 102  CO2 27 28 29   GLUCOSE 104* 112* 107*  BUN 40* 43* 32*  CREATININE 4.25* 3.27* 1.33*  CALCIUM 8.9 8.3* 8.4*  PHOS  --  4.4  --    Liver Function Tests: Recent Labs  Lab 12/06/19 0527 12/06/19 1415  AST 68*  --   ALT 38  --   ALKPHOS 82  --   BILITOT 0.8  --   PROT 8.0  --   ALBUMIN 4.6 4.0   No results for input(s): LIPASE, AMYLASE in the last 168 hours. No results for input(s): AMMONIA in the last 168 hours. CBC: Recent Labs  Lab 12/06/19 0527 12/07/19 0605  WBC 13.2* 7.5  HGB 16.0 14.0  HCT 47.3 42.9  MCV 84.2 87.2  PLT 348 287   Cardiac Enzymes: No results for input(s): CKTOTAL, CKMB, CKMBINDEX, TROPONINI in the last 168 hours. BNP: Invalid input(s): POCBNP CBG: No results for input(s): GLUCAP in the last 168 hours. D-Dimer No results for input(s): DDIMER in the last 72 hours. Hgb A1c No results for input(s): HGBA1C in the last 72 hours. Lipid Profile No results for input(s): CHOL, HDL, LDLCALC, TRIG, CHOLHDL, LDLDIRECT in the last 72 hours. Thyroid function studies No results for input(s): TSH, T4TOTAL, T3FREE, THYROIDAB in the last 72 hours.  Invalid input(s): FREET3 Anemia work up No results for input(s): VITAMINB12, FOLATE, FERRITIN, TIBC, IRON, RETICCTPCT in the last 72 hours. Urinalysis    Component Value Date/Time   COLORURINE YELLOW 12/06/2019 [place] 12/06/2019 1349   LABSPEC 1.020 12/06/2019 1349   PHURINE 5.5 12/06/2019 1349   GLUCOSEU NEGATIVE 12/06/2019 1349   HGBUR SMALL (A) 12/06/2019 1349   BILIRUBINUR NEGATIVE 12/06/2019 1349   KETONESUR NEGATIVE 12/06/2019 1349   PROTEINUR 100 (A) 12/06/2019 1349   NITRITE NEGATIVE  12/06/2019 1349   LEUKOCYTESUR NEGATIVE 12/06/2019 1349   Sepsis Labs Invalid input(s): PROCALCITONIN,  WBC,  LACTICIDVEN Microbiology Recent Results (from the past 240 hour(s))  Respiratory Panel by RT PCR (Flu A&B, Covid) - Nasopharyngeal Swab     Status: None   Collection Time: 12/06/19  9:05 AM   Specimen: Nasopharyngeal Swab  Result Value Ref Range Status   SARS Coronavirus 2 by RT PCR NEGATIVE NEGATIVE Final    Comment: (NOTE) SARS-CoV-2 target nucleic acids are NOT DETECTED. The SARS-CoV-2 RNA is generally detectable in upper respiratoy specimens during the acute phase of infection. The lowest concentration of SARS-CoV-2 viral copies this assay can detect is 131 copies/mL. A negative result does not preclude SARS-Cov-2 infection and should not be used as the sole basis for treatment or other patient management decisions. A negative result may occur with  improper specimen  collection/handling, submission of specimen other than nasopharyngeal swab, presence of viral mutation(s) within the areas targeted by this assay, and inadequate number of viral copies (<131 copies/mL). A negative result must be combined with clinical observations, patient history, and epidemiological information. The expected result is Negative. Fact Sheet for Patients:  PinkCheek.be Fact Sheet for Healthcare Providers:  GravelBags.it This test is not yet ap proved or cleared by the [place] FDA and  has been authorized for detection and/or diagnosis of SARS-CoV-2 by FDA under an Emergency Use Authorization (EUA). This EUA will remain  in effect (meaning this test can be used) for the duration of the COVID-19 declaration under Section 564(b)(1) of the Act, 21 U.S.C. section 360bbb-3(b)(1), unless the authorization is terminated or revoked sooner.    Influenza A by PCR NEGATIVE NEGATIVE Final   Influenza B by PCR NEGATIVE NEGATIVE Final     Comment: (NOTE) The Xpert Xpress SARS-CoV-2/FLU/RSV assay is intended as an aid in  the diagnosis of influenza from Nasopharyngeal swab specimens and  should not be used as a sole basis for treatment. Nasal washings and  aspirates are unacceptable for Xpert Xpress SARS-CoV-2/FLU/RSV  testing. Fact Sheet for Patients: PinkCheek.be Fact Sheet for Healthcare Providers: GravelBags.it This test is not yet approved or cleared by the [place] FDA and  has been authorized for detection and/or diagnosis of SARS-CoV-2 by  FDA under an Emergency Use Authorization (EUA). This EUA will remain  in effect (meaning this test can be used) for the duration of the  Covid-19 declaration under Section 564(b)(1) of the Act, 21  U.S.C. section 360bbb-3(b)(1), unless the authorization is  terminated or revoked. Performed at [facility], [address], [place], [place] [address]      Time coordinating discharge: Over 30 minutes  SIGNED:   Nolberto Hanlon, MD  [facility] 12/08/2019, 12:13 PM Pager   If 7PM-7AM, please contact night-coverage [email or web address] Password [email or web address]

## 2019-12-25 ENCOUNTER — Encounter: Payer: Self-pay | Admitting: Emergency Medicine

## 2019-12-25 ENCOUNTER — Other Ambulatory Visit: Payer: Self-pay

## 2019-12-25 ENCOUNTER — Emergency Department
Admission: EM | Admit: 2019-12-25 | Discharge: 2019-12-25 | Disposition: A | Discharge status: Home / Self Care | Payer: 59 | Attending: Emergency Medicine | Admitting: Emergency Medicine

## 2019-12-25 DIAGNOSIS — F17210 Nicotine dependence, cigarettes, uncomplicated: Secondary | ICD-10-CM | POA: Diagnosis not present

## 2019-12-25 DIAGNOSIS — I10 Essential (primary) hypertension: Secondary | ICD-10-CM | POA: Insufficient documentation

## 2019-12-25 DIAGNOSIS — F1910 Other psychoactive substance abuse, uncomplicated: Secondary | ICD-10-CM | POA: Diagnosis not present

## 2019-12-25 DIAGNOSIS — J45909 Unspecified asthma, uncomplicated: Secondary | ICD-10-CM | POA: Insufficient documentation

## 2019-12-25 DIAGNOSIS — Z79899 Other long term (current) drug therapy: Secondary | ICD-10-CM | POA: Diagnosis not present

## 2019-12-25 DIAGNOSIS — R4182 Altered mental status, unspecified: Secondary | ICD-10-CM | POA: Insufficient documentation

## 2019-12-25 DIAGNOSIS — E119 Type 2 diabetes mellitus without complications: Secondary | ICD-10-CM | POA: Insufficient documentation

## 2019-12-25 HISTORY — DX: Type 2 diabetes mellitus without complications: E11.9

## 2019-12-25 HISTORY — DX: Bipolar disorder, unspecified: F31.9

## 2019-12-25 LAB — CBC
HCT: 45.8 % (ref 39.0–52.0)
Hemoglobin: 16.1 g/dL (ref 13.0–17.0)
MCH: 29 pg (ref 26.0–34.0)
MCHC: 35.2 g/dL (ref 30.0–36.0)
MCV: 82.4 fL (ref 80.0–100.0)
Platelets: 402 10*3/uL — ABNORMAL HIGH (ref 150–400)
RBC: 5.56 MIL/uL (ref 4.22–5.81)
RDW: 12.5 % (ref 11.5–15.5)
WBC: 13.4 10*3/uL — ABNORMAL HIGH (ref 4.0–10.5)
nRBC: 0 % (ref 0.0–0.2)

## 2019-12-25 LAB — BASIC METABOLIC PANEL
Anion gap: 14 (ref 5–15)
BUN: 19 mg/dL (ref 6–20)
CO2: 22 mmol/L (ref 22–32)
Calcium: 9.5 mg/dL (ref 8.9–10.3)
Chloride: 101 mmol/L (ref 98–111)
Creatinine, Ser: 0.96 mg/dL (ref 0.61–1.24)
GFR calc Af Amer: >60 mL/min (ref >60)
GFR calc non Af Amer: >60 mL/min (ref >60)
Glucose, Bld: 119 mg/dL — ABNORMAL HIGH (ref 70–99)
Potassium: 3.4 mmol/L — ABNORMAL LOW (ref 3.5–5.1)
Sodium: 137 mmol/L (ref 135–145)

## 2019-12-25 LAB — ETHANOL: Alcohol, Ethyl (B): <10 mg/dL (ref <10)

## 2019-12-25 MED ORDER — LORAZEPAM 2 MG/ML IJ SOLN
INTRAMUSCULAR | Status: AC
  Administered 2019-12-25: 2 mg via INTRAMUSCULAR
  Filled 2019-12-25: qty 1

## 2019-12-25 MED ORDER — LORAZEPAM 2 MG/ML IJ SOLN: 2.0000 mg | Freq: Once | INTRAMUSCULAR | Status: AC

## 2019-12-25 NOTE — ED Triage Notes (Signed)
Pt presents from home via [facility] with c/o altered mental status/hallucinations according to family members. hx hyptension, diabetes, bipolar, no missed meds per family, cbg 140. Pt currently alert, but speaking with illogical sentences at this time. Pt family reported to ems that pt may have taken substance at home. Pt states pt was normal last night and began this behavior this am.

## 2019-12-25 NOTE — ED Provider Notes (Signed)
[facility] Emergency Department Provider Note   ____________________________________________    I have reviewed the triage vital signs and the nursing notes.   HISTORY  Chief Complaint Altered Mental Status   Patient unable to provide any history due to altered mental status  HPI Joseph [name] is a 39 y.o. male presents with altered mental status.  Per EMS family suspect the patient may have used a substance called DMT last night which is hallucinogenic.  They found him altered this morning.  Review of records demonstrates the patient does have a history of cocaine and heroin abuse and has been seen in the emergency department several times for this.  Past Medical History:  Diagnosis Date  . Asthma   . Bipolar 1 disorder ([place])   . Diabetes mellitus without complication ([place])   . Drug abuse ([place])   . Hypertension     Patient Active Problem List   Diagnosis Date Noted  . AKI (acute kidney injury) ([place]) 12/06/2019  . Near syncope 12/06/2019  . ADHD 10/10/2019  . HTN (hypertension) 10/10/2019  . Thyroid disorder 10/10/2019  . Anxiety 10/10/2019  . Seizure ([place]) 09/02/2015    Past Surgical History:  Procedure Laterality Date  . APPENDECTOMY    . BACK SURGERY    . CHOLECYSTECTOMY      Prior to Admission medications   Medication Sig Start Date End Date Taking? Authorizing Provider  acetaminophen (TYLENOL) 325 MG tablet Take 2 tablets (650 mg total) by mouth every 6 (six) hours as needed for mild pain (or Fever >/= 101). 12/08/19  Yes Nolberto Hanlon, MD  amphetamine-dextroamphetamine (ADDERALL) 10 MG tablet Take 10 mg by mouth 2 (two) times daily. 12/03/19  Yes [provider]  gabapentin (NEURONTIN) 300 MG capsule Take 600 mg by mouth 3 (three) times daily.    Yes [provider]  SUBOXONE 8-2 MG FILM Place 1 Film under the tongue in the morning and at bedtime.    Yes [provider]  levETIRAcetam (KEPPRA) 500 MG  tablet Take 1 tablet (500 mg total) by mouth 2 (two) times daily. 12/08/19   Nolberto Hanlon, MD  levothyroxine (SYNTHROID) 100 MCG tablet Take 1 tablet (100 mcg total) by mouth daily before breakfast. Patient not taking: Reported on 12/25/2019 12/08/19   Nolberto Hanlon, MD  naloxone [facility]) nasal spray 4 mg/0.1 mL Use in the event of suspected overdose of heroin or opiates 11/05/16   Merlyn Lot, MD     Allergies Patient has no known allergies.  Family History  Problem Relation Age of Onset  . Hypertension Other     Social History Social History   Tobacco Use  . Smoking status: Current Every Day Smoker    Packs/day: 0.50    Years: 15.00    Pack years: 7.50    Types: Cigarettes  . Smokeless tobacco: Current User  Substance Use Topics  . Alcohol use: No  . Drug use: Yes    Types: Cocaine, Marijuana, IV    Comment: Percocets, sniffs heroin    Unable to obtain review of Systems     ____________________________________________   PHYSICAL EXAM:  VITAL SIGNS: ED Triage Vitals  Enc Vitals Group     BP 12/25/19 1047 (!) 136/97     Pulse Rate 12/25/19 1047 (!) 113     Resp 12/25/19 1047 (!) 24     Temp 12/25/19 1047 98.7 F (37.1 C)     Temp Source 12/25/19 1047 Axillary  SpO2 12/25/19 1047 100 %     Weight 12/25/19 1049 (!) 146.1 kg (322 lb)     Height 12/25/19 1049 1.905 m (6\' 3" )     Head Circumference --      Peak Flow --      Pain Score --      Pain Loc --      Pain Edu? --      Excl. in GC? --     Constitutional: Alert, agitated Eyes: Conjunctivae are normal.  PERRLA Head: Atraumatic. Nose: No congestion/rhinnorhea. Mouth/Throat: Mucous membranes are moist.   Neck:  Painless ROM Cardiovascular: Tachycardia, regular rhythm. Grossly normal heart sounds.  Good peripheral circulation. Respiratory: Normal respiratory effort.  No retractions. Lungs CTAB. Gastrointestinal: Soft and nontender. No distention.  Musculoskeletal:  Warm and well  perfused Neurologic:  Normal speech and language. No gross focal neurologic deficits are appreciated.  Skin:  Skin is warm, dry and intact.   ____________________________________________   LABS (all labs ordered are listed, but only abnormal results are displayed)  Labs Reviewed  CBC - Abnormal; Notable for the following components:      Result Value   WBC 13.4 (*)    Platelets 402 (*)    All other components within normal limits  BASIC METABOLIC PANEL - Abnormal; Notable for the following components:   Potassium 3.4 (*)    Glucose, Bld 119 (*)    All other components within normal limits  ETHANOL  URINE DRUG SCREEN, QUALITATIVE ([facility] ONLY)   ____________________________________________  EKG  None ____________________________________________  RADIOLOGY  None ____________________________________________   PROCEDURES  Procedure(s) performed: No  Procedures   Critical Care performed: No ____________________________________________   INITIAL IMPRESSION / ASSESSMENT AND PLAN / ED COURSE  Pertinent labs & imaging results that were available during my care of the patient were reviewed by me and considered in my medical decision making (see chart for details).  Patient presents with altered mental status, tachycardic, mild tachypnea, satting well.  Afebrile.  Very suspicious for substance related altered mental status, given that patient is afebrile and his history.  Will obtain labs, placed in the cardiac monitor, obtain EKG and monitor carefully.  Will give 2 mg of IM Ativan  ----------------------------------------- 1:15 PM on 12/25/2019 -----------------------------------------  Patient is clinically sober, he is standing at the door of his room, he is impatient to leave.  He states that "he probably did a hallucinogen "  Lab work is overall reassuring, nonspecific mild elevation in white blood cell count.  Appropriate discharge at this time with outpatient  follow-up    ____________________________________________   FINAL CLINICAL IMPRESSION(S) / ED DIAGNOSES  Final diagnoses:  Altered mental status, unspecified altered mental status type  Substance abuse (HCC)        Note:  This document was prepared using Dragon voice recognition software and may include unintentional dictation errors.   12/27/2019, MD 12/25/19 1315

## 2019-12-25 NOTE — ED Notes (Signed)
Pt now ambulatory to bed side commode with steady gait. Pt able to respond appropriately to all questions asked. MD Kinner at bedside with patient as well. Pt agreeable with plan to D/C and contacting family to pick up patient from ER. Pt in NAD at time of D/C. E-signature not working at this time. Pt verbalized understanding of D/C instructions, prescriptions and follow up care with no further questions at this time. Pt in NAD and ambulatory at time of D/C.

## 2021-07-03 ENCOUNTER — Ambulatory Visit: Payer: Self-pay | Admitting: *Deleted

## 2021-07-03 NOTE — Telephone Encounter (Signed)
Pt's GF calling, pt present. States went to [facility] 06/27/21, covid positive. CAlling to secure appt, new pt, with [facility]. States told in UC on 11/22 pt "Was in heart failure and needed to see a provider."  Does not have PCP. States legs swollen, SOB at rest, very fatigued, "Hard to keep awake." Pt falling asleep during call. States wheezing, SOB "All the time." Advised ED. States will follow disposition. Will CB to schedule NP appt. Reason for Disposition . MODERATE difficulty breathing (e.g., speaks in phrases, SOB even at rest, pulse 100-120)  Protocols used: Coronavirus (COVID-19) Diagnosed or Suspected-A-AH

## 2021-09-29 ENCOUNTER — Ambulatory Visit: Payer: 59 | Admitting: Nurse Practitioner

## 2021-12-29 IMAGING — US US RENAL
1 series · 14 of 25 positions shown · non-contrast
Comparison: CT abdomen and pelvis 02/04/2019

CLINICAL DATA: Acute kidney injury, history asthma, drug abuse,
hypertension, smoker

EXAM:
RENAL / URINARY TRACT ULTRASOUND COMPLETE

[Series 1: us renal · 14 of 33 slices shown]
[im 1/33]
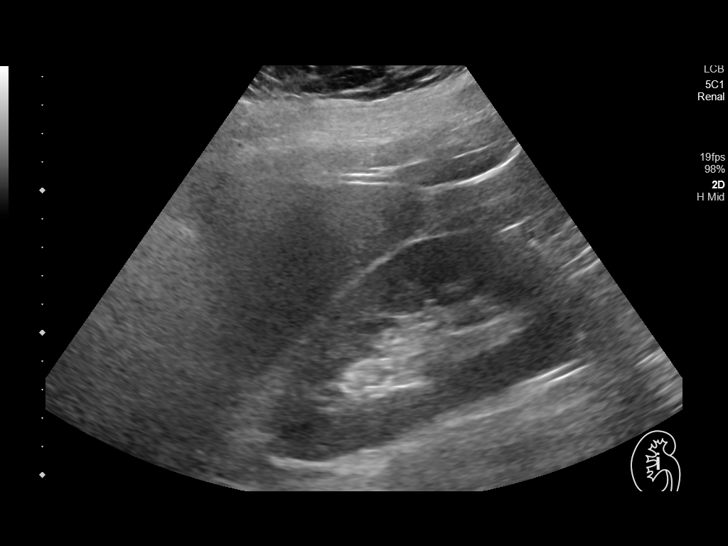
[im 3/33]
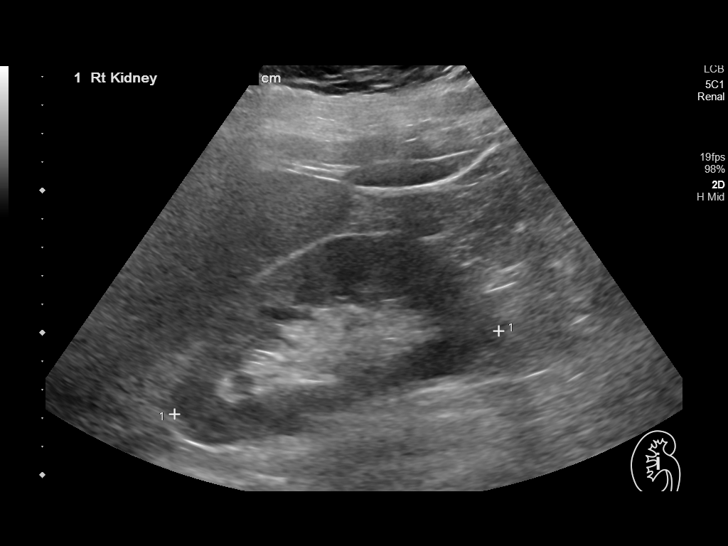
[im 6/33]
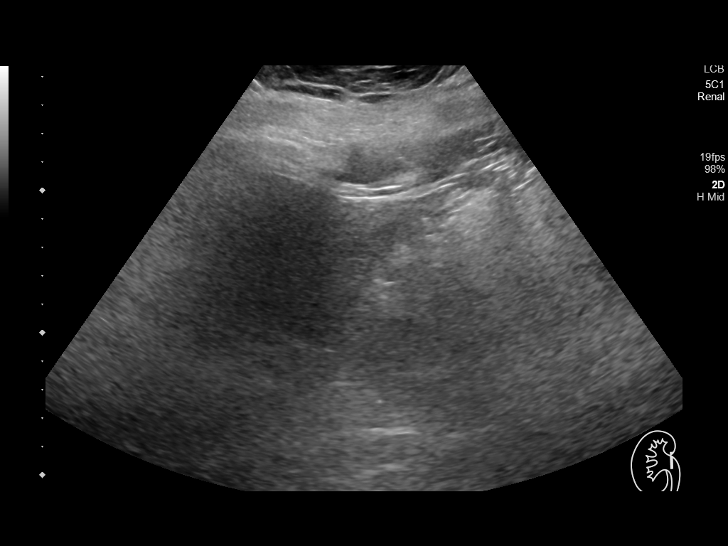
[im 9/33]
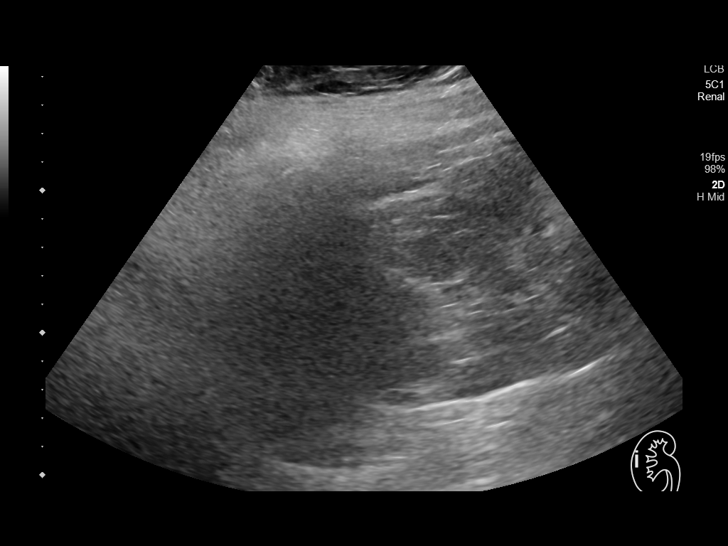
[im 11/33]
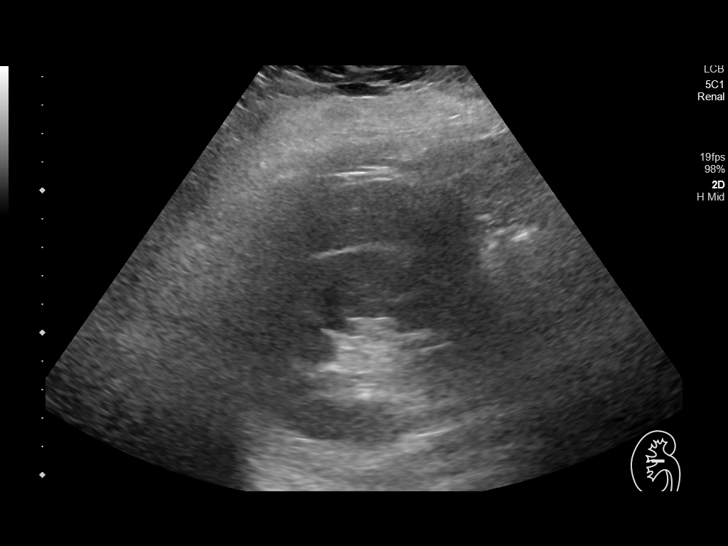
[im 13/33]
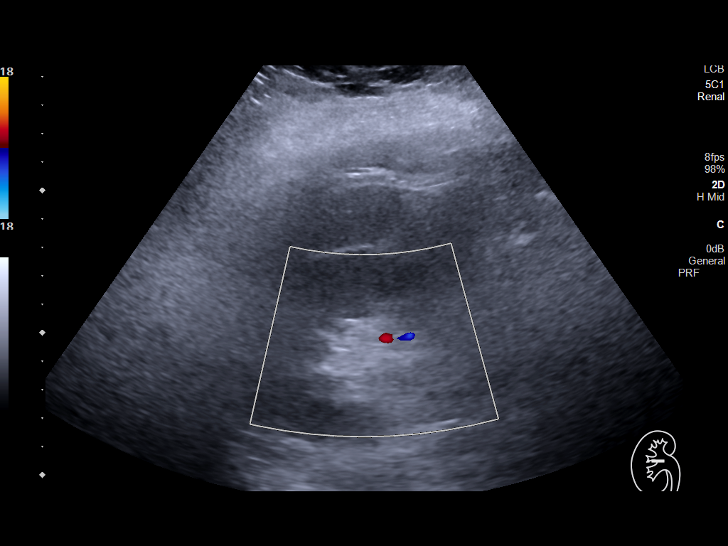
[im 15/33]
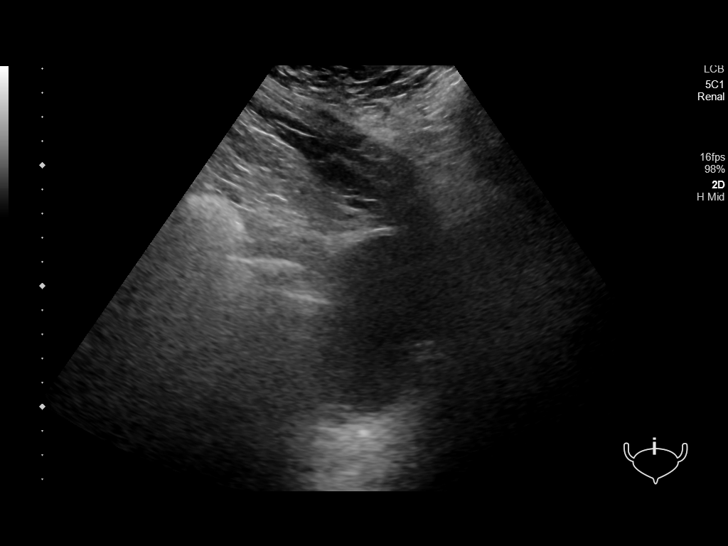
[im 18/33]
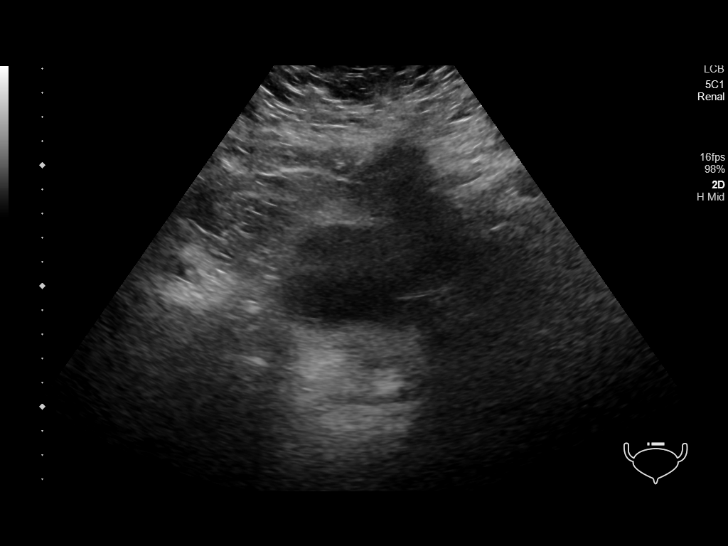
[im 21/33]
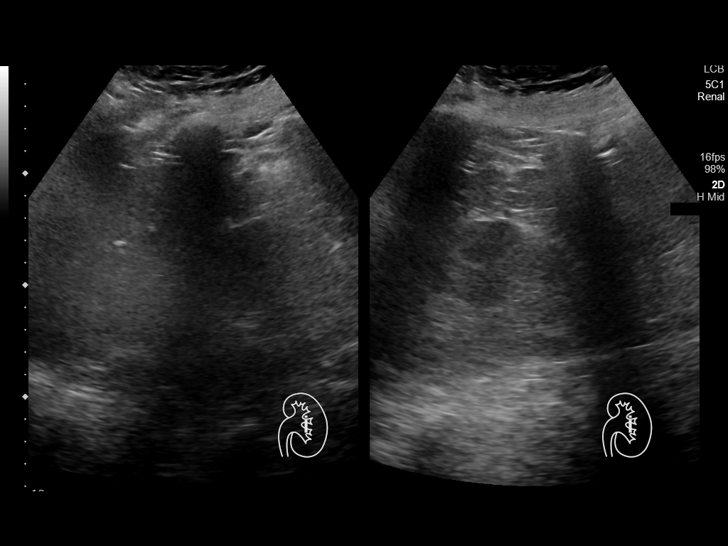
[im 22/33]
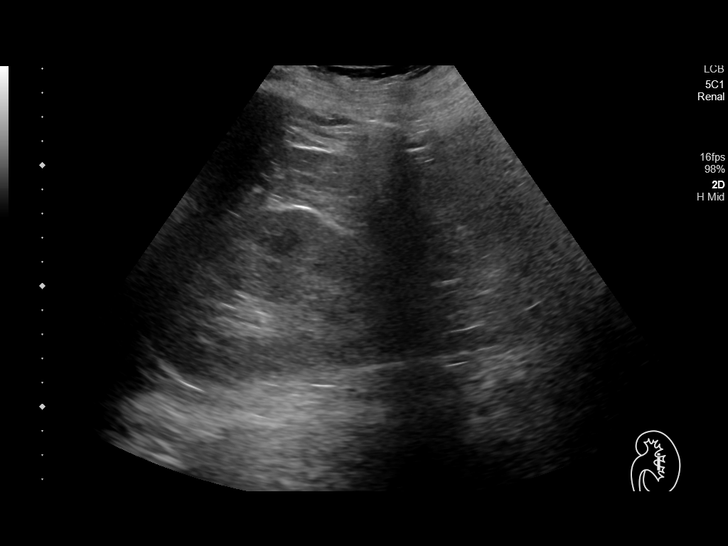
[im 25/33]
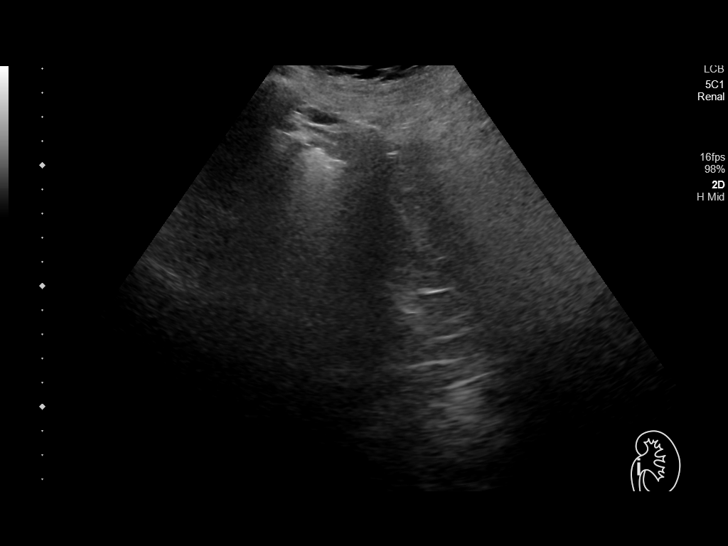
[im 27/33]
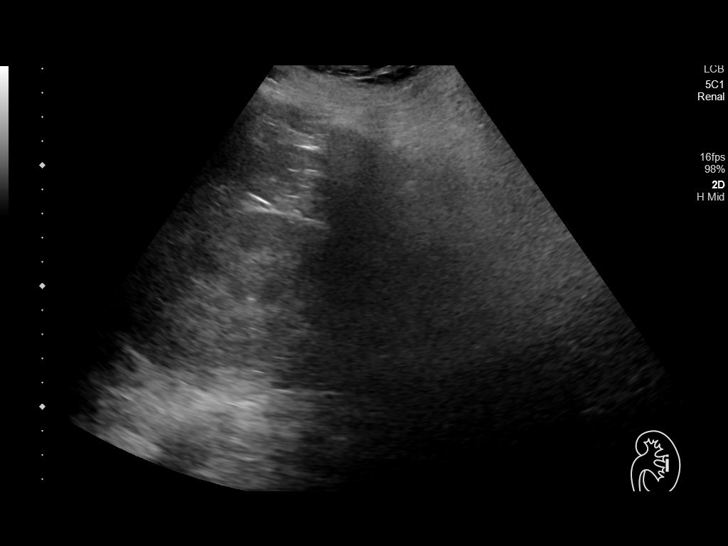
[im 30/33]
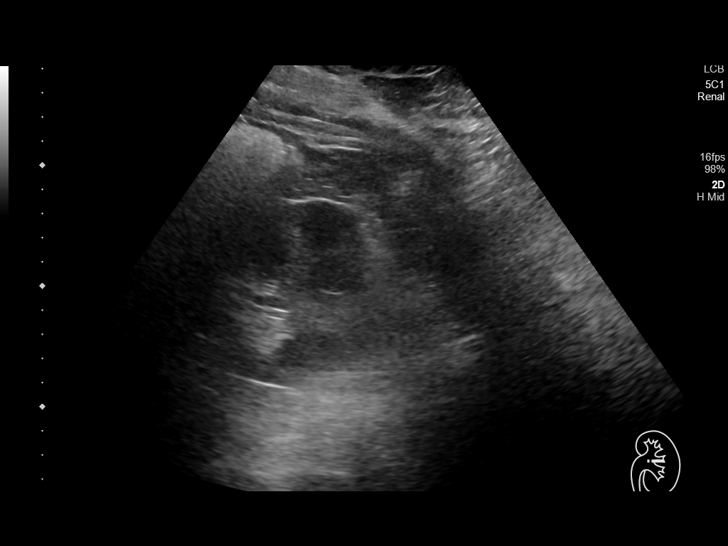
[im 33/33]
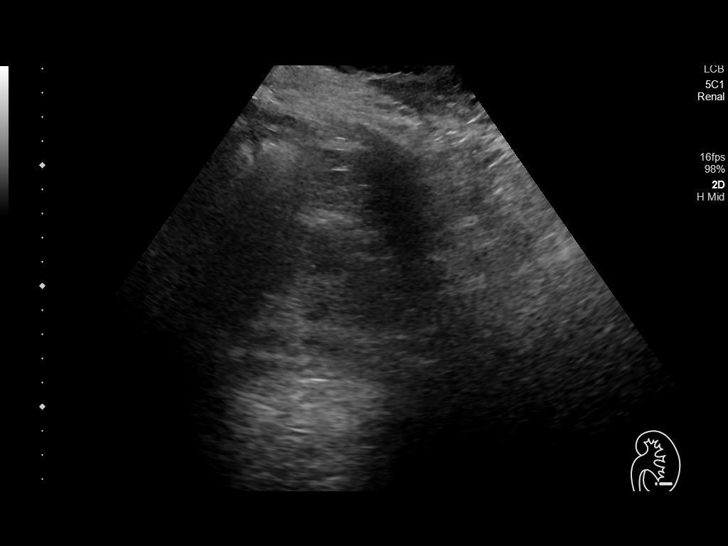

[14 of 25 positions shown; findings below may reference images not displayed]

FINDINGS: Right Kidney:

Renal measurements: 11.7 x 6.8 x 6.1 cm = volume: 257 mL. Normal
cortical thickness and echogenicity. Suboptimal visualization due to
body habitus. No evidence of renal mass or hydronephrosis.

Left Kidney:

Renal measurements: 11.4 x 7.2 x 7.3 cm = volume: 318 mL.
Suboptimally visualized due to body habitus. Grossly normal cortical
thickness and echogenicity. No mass or hydronephrosis.

Bladder:

Contains minimal urine, suboptimally evaluated.

Other:

N/A
IMPRESSION: No renal sonographic abnormalities identified.

## 2021-12-29 IMAGING — DX DG KNEE COMPLETE 4+V*L*
4 series · 4 of 4 positions shown · non-contrast
Comparison: None.

CLINICAL DATA: Fall through glass table

EXAM:
LEFT KNEE - COMPLETE 4+ VIEW

[knee ap]
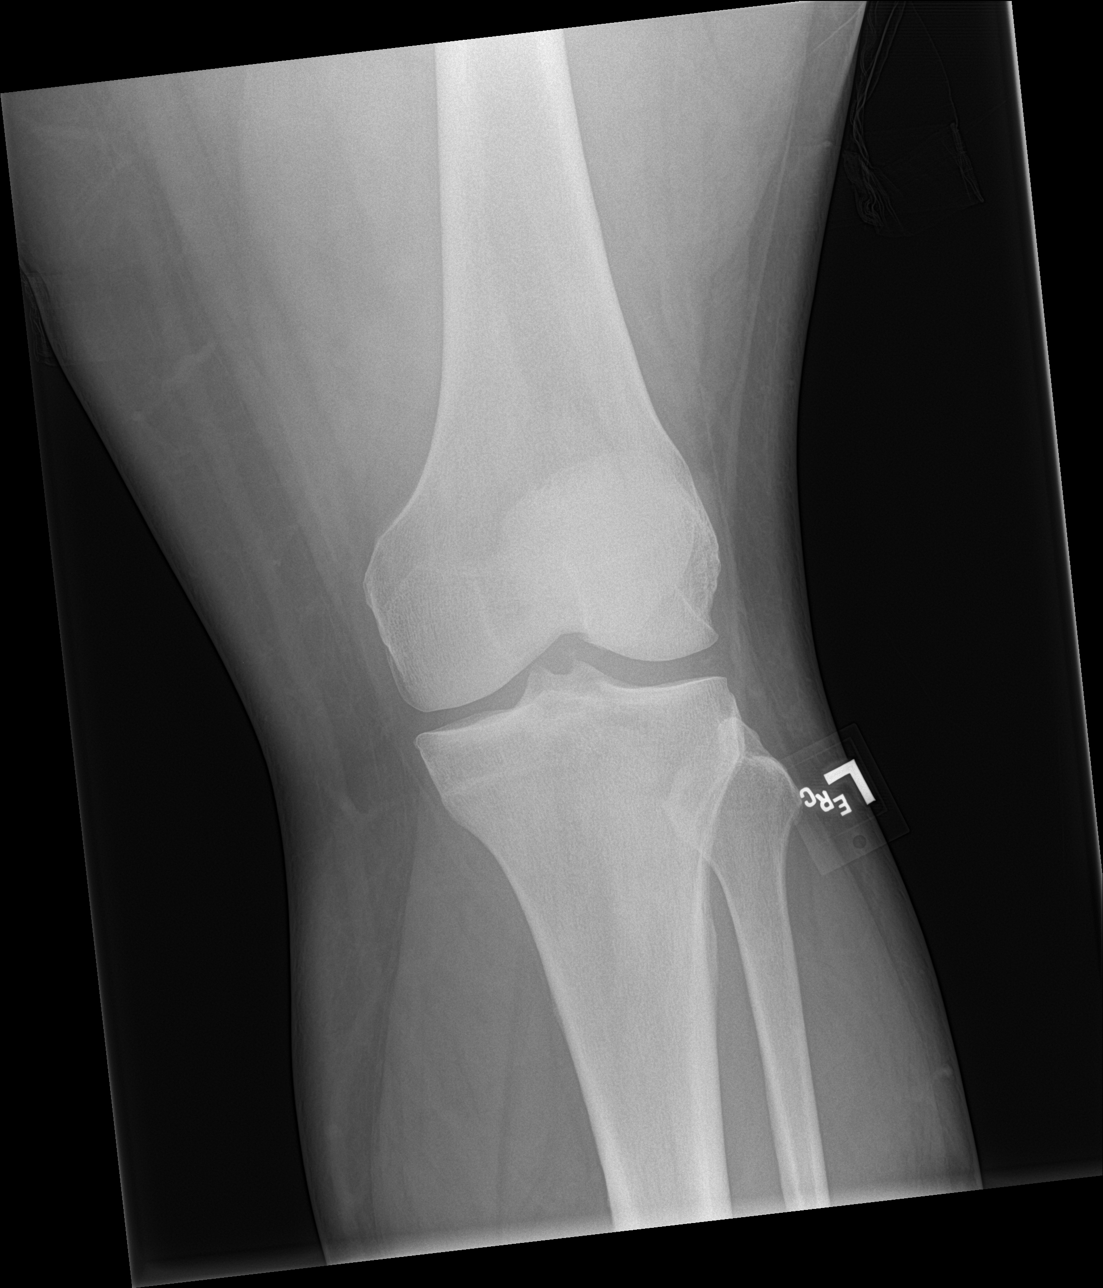

[knee tunnel]
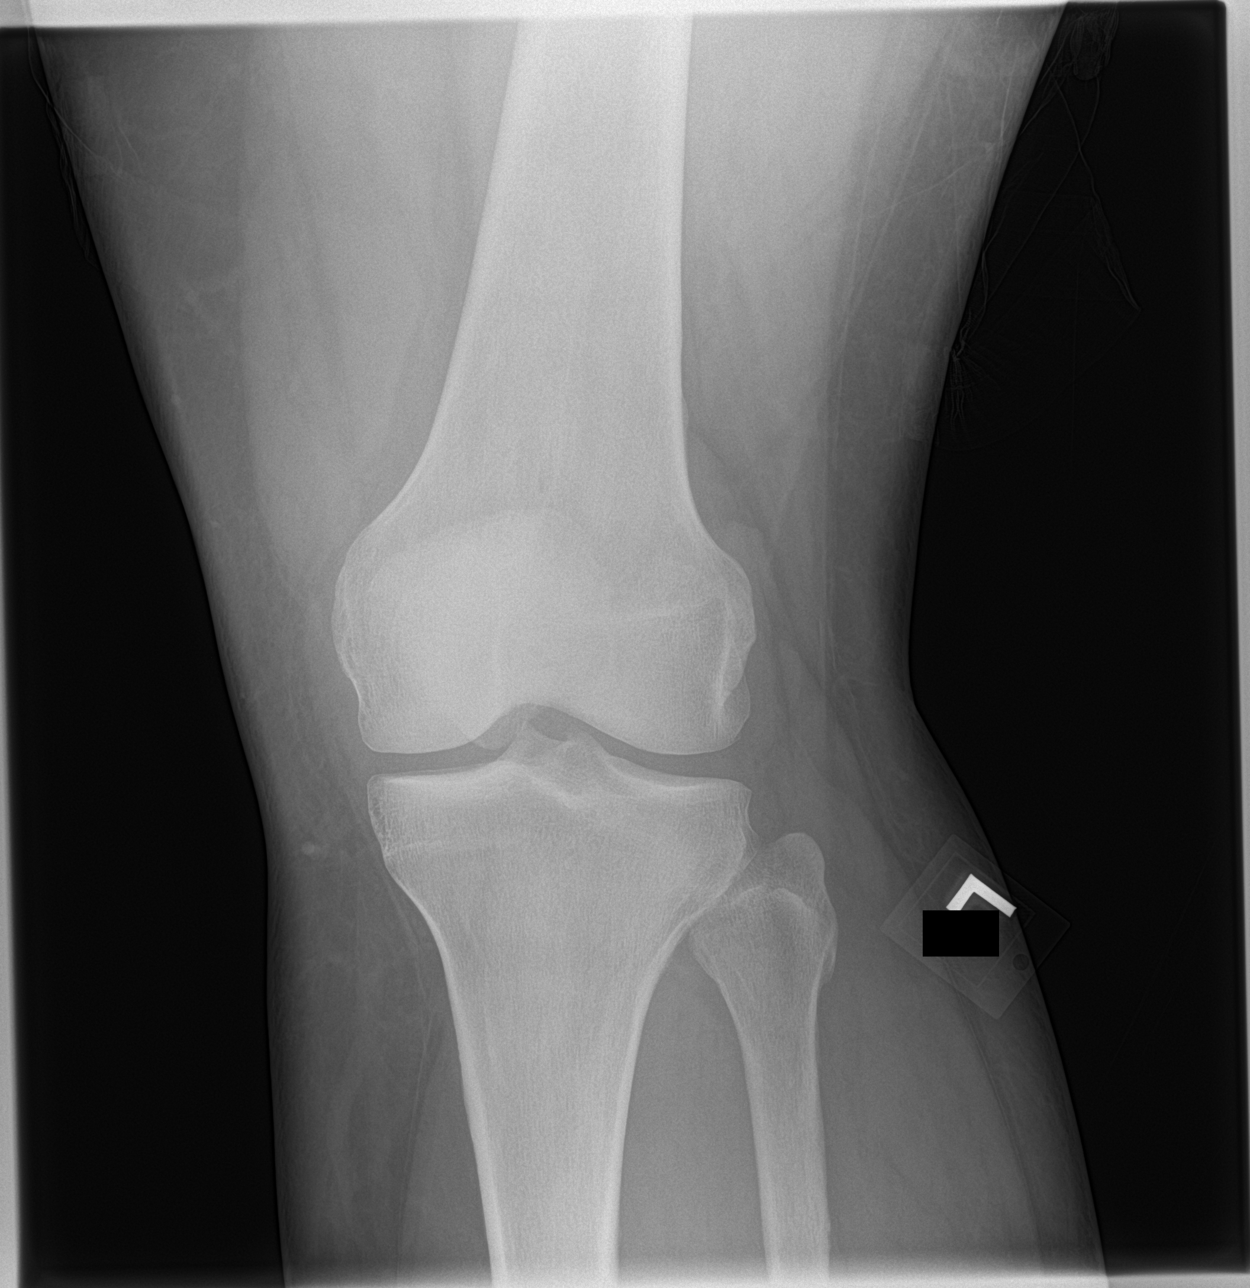

[knee lat]
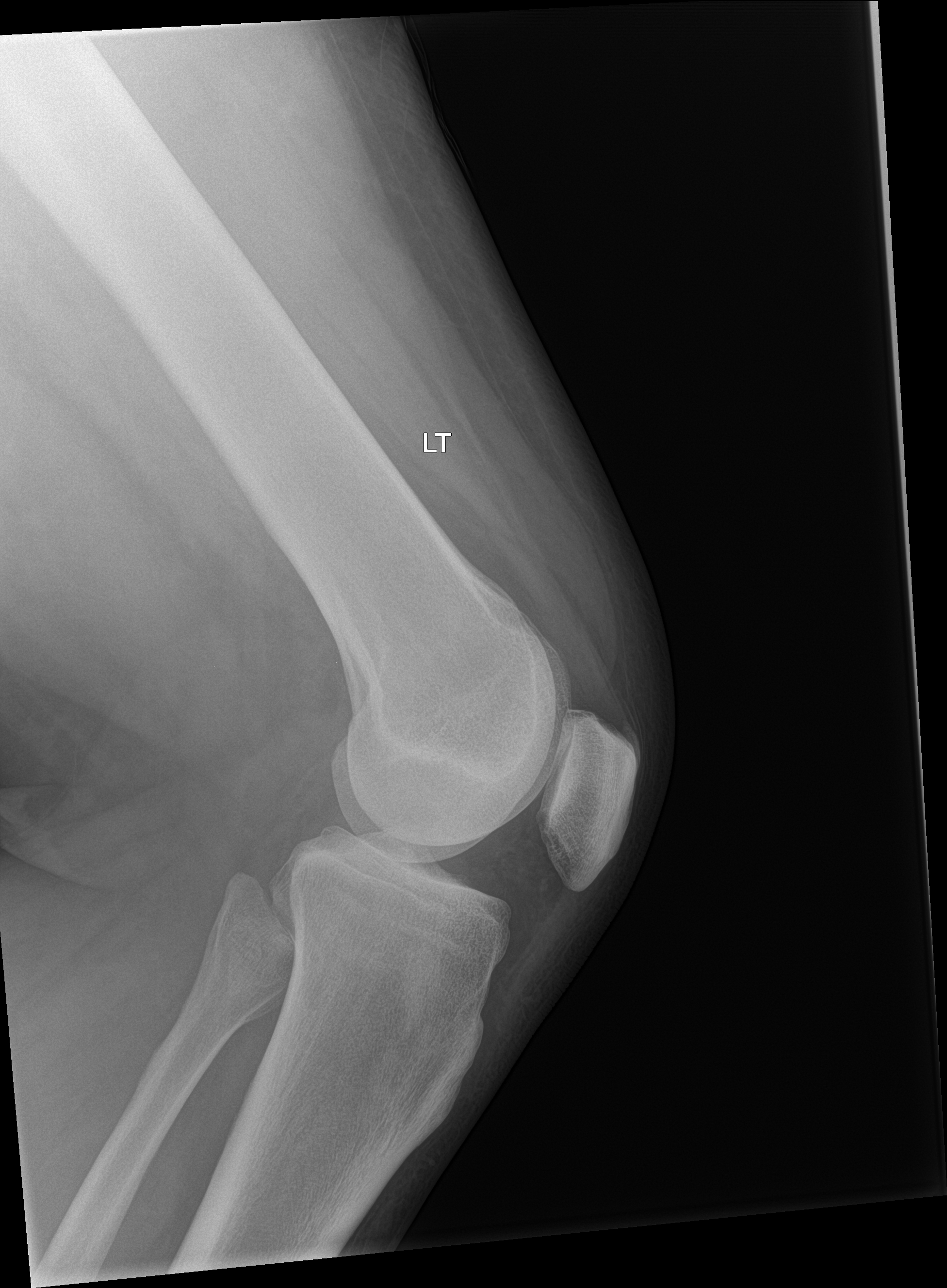

[knee obl]
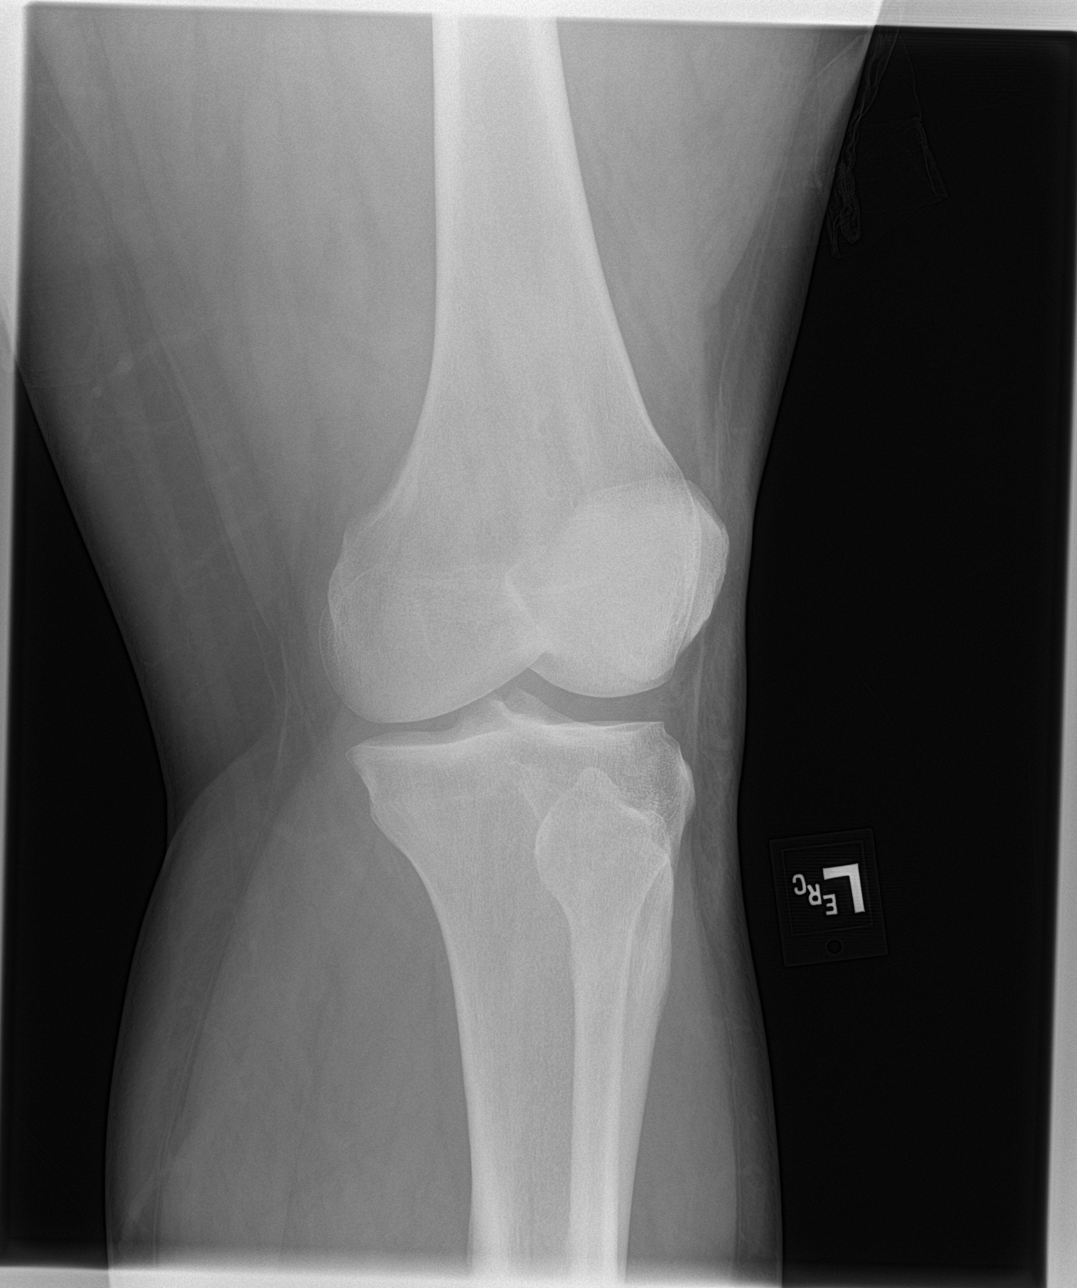

[4 of 4 positions shown; findings below may reference images not displayed]

FINDINGS: No evidence of fracture, dislocation, or joint effusion. No evidence
of arthropathy or other focal bone abnormality. Soft tissues are
unremarkable.
IMPRESSION: Negative.

## 2022-03-06 DIAGNOSIS — F900 Attention-deficit hyperactivity disorder, predominantly inattentive type: Secondary | ICD-10-CM | POA: Diagnosis not present

## 2022-03-06 DIAGNOSIS — R69 Illness, unspecified: Secondary | ICD-10-CM | POA: Diagnosis not present

## 2022-03-06 DIAGNOSIS — Z79899 Other long term (current) drug therapy: Secondary | ICD-10-CM | POA: Diagnosis not present

## 2022-03-19 DIAGNOSIS — F909 Attention-deficit hyperactivity disorder, unspecified type: Secondary | ICD-10-CM | POA: Diagnosis not present

## 2022-03-19 DIAGNOSIS — F900 Attention-deficit hyperactivity disorder, predominantly inattentive type: Secondary | ICD-10-CM | POA: Diagnosis not present

## 2022-03-19 DIAGNOSIS — R69 Illness, unspecified: Secondary | ICD-10-CM | POA: Diagnosis not present

## 2022-03-19 DIAGNOSIS — F1120 Opioid dependence, uncomplicated: Secondary | ICD-10-CM | POA: Diagnosis not present

## 2022-03-19 DIAGNOSIS — Z79899 Other long term (current) drug therapy: Secondary | ICD-10-CM | POA: Diagnosis not present

## 2022-04-10 DIAGNOSIS — F900 Attention-deficit hyperactivity disorder, predominantly inattentive type: Secondary | ICD-10-CM | POA: Diagnosis not present

## 2022-04-10 DIAGNOSIS — Z79899 Other long term (current) drug therapy: Secondary | ICD-10-CM | POA: Diagnosis not present

## 2022-04-10 DIAGNOSIS — R69 Illness, unspecified: Secondary | ICD-10-CM | POA: Diagnosis not present

## 2022-05-01 DIAGNOSIS — R69 Illness, unspecified: Secondary | ICD-10-CM | POA: Diagnosis not present

## 2022-05-23 DIAGNOSIS — M549 Dorsalgia, unspecified: Secondary | ICD-10-CM | POA: Diagnosis not present

## 2022-05-23 DIAGNOSIS — E039 Hypothyroidism, unspecified: Secondary | ICD-10-CM | POA: Insufficient documentation

## 2022-05-23 DIAGNOSIS — F411 Generalized anxiety disorder: Secondary | ICD-10-CM | POA: Insufficient documentation

## 2022-05-23 DIAGNOSIS — G8929 Other chronic pain: Secondary | ICD-10-CM | POA: Insufficient documentation

## 2022-05-23 DIAGNOSIS — Z6841 Body Mass Index (BMI) 40.0 and over, adult: Secondary | ICD-10-CM | POA: Diagnosis not present

## 2022-05-23 DIAGNOSIS — R0683 Snoring: Secondary | ICD-10-CM | POA: Diagnosis not present

## 2022-05-23 DIAGNOSIS — M25561 Pain in right knee: Secondary | ICD-10-CM | POA: Diagnosis not present

## 2022-05-23 DIAGNOSIS — G4752 REM sleep behavior disorder: Secondary | ICD-10-CM | POA: Insufficient documentation

## 2022-05-23 DIAGNOSIS — M25473 Effusion, unspecified ankle: Secondary | ICD-10-CM | POA: Diagnosis not present

## 2022-05-23 DIAGNOSIS — E66813 Obesity, class 3: Secondary | ICD-10-CM | POA: Insufficient documentation

## 2022-05-23 DIAGNOSIS — R5383 Other fatigue: Secondary | ICD-10-CM | POA: Diagnosis not present

## 2022-05-23 DIAGNOSIS — R400 Somnolence: Secondary | ICD-10-CM | POA: Diagnosis not present

## 2022-05-23 DIAGNOSIS — M25572 Pain in left ankle and joints of left foot: Secondary | ICD-10-CM | POA: Diagnosis not present

## 2022-05-23 DIAGNOSIS — R69 Illness, unspecified: Secondary | ICD-10-CM | POA: Diagnosis not present

## 2022-06-05 DIAGNOSIS — F909 Attention-deficit hyperactivity disorder, unspecified type: Secondary | ICD-10-CM | POA: Diagnosis not present

## 2022-06-05 DIAGNOSIS — R69 Illness, unspecified: Secondary | ICD-10-CM | POA: Diagnosis not present

## 2022-06-05 DIAGNOSIS — Z79899 Other long term (current) drug therapy: Secondary | ICD-10-CM | POA: Diagnosis not present

## 2022-06-05 DIAGNOSIS — F900 Attention-deficit hyperactivity disorder, predominantly inattentive type: Secondary | ICD-10-CM | POA: Diagnosis not present

## 2022-06-26 DIAGNOSIS — F900 Attention-deficit hyperactivity disorder, predominantly inattentive type: Secondary | ICD-10-CM | POA: Diagnosis not present

## 2022-06-26 DIAGNOSIS — Z79899 Other long term (current) drug therapy: Secondary | ICD-10-CM | POA: Diagnosis not present

## 2022-06-26 DIAGNOSIS — R69 Illness, unspecified: Secondary | ICD-10-CM | POA: Diagnosis not present

## 2022-07-04 DIAGNOSIS — F900 Attention-deficit hyperactivity disorder, predominantly inattentive type: Secondary | ICD-10-CM | POA: Diagnosis not present

## 2022-07-04 DIAGNOSIS — R69 Illness, unspecified: Secondary | ICD-10-CM | POA: Diagnosis not present

## 2022-07-04 DIAGNOSIS — Z79899 Other long term (current) drug therapy: Secondary | ICD-10-CM | POA: Diagnosis not present

## 2022-07-24 DIAGNOSIS — Z79899 Other long term (current) drug therapy: Secondary | ICD-10-CM | POA: Diagnosis not present

## 2022-07-24 DIAGNOSIS — F900 Attention-deficit hyperactivity disorder, predominantly inattentive type: Secondary | ICD-10-CM | POA: Diagnosis not present

## 2022-07-24 DIAGNOSIS — R69 Illness, unspecified: Secondary | ICD-10-CM | POA: Diagnosis not present

## 2022-08-29 DIAGNOSIS — Z79899 Other long term (current) drug therapy: Secondary | ICD-10-CM | POA: Diagnosis not present

## 2022-08-29 DIAGNOSIS — F1120 Opioid dependence, uncomplicated: Secondary | ICD-10-CM | POA: Diagnosis not present

## 2022-08-29 DIAGNOSIS — F900 Attention-deficit hyperactivity disorder, predominantly inattentive type: Secondary | ICD-10-CM | POA: Diagnosis not present

## 2022-09-18 DIAGNOSIS — M1712 Unilateral primary osteoarthritis, left knee: Secondary | ICD-10-CM | POA: Insufficient documentation

## 2022-09-18 DIAGNOSIS — M25562 Pain in left knee: Secondary | ICD-10-CM | POA: Diagnosis not present

## 2023-01-08 ENCOUNTER — Ambulatory Visit: Payer: Medicaid Other | Admitting: Family Medicine

## 2023-01-26 DIAGNOSIS — R410 Disorientation, unspecified: Secondary | ICD-10-CM | POA: Diagnosis not present

## 2023-01-26 DIAGNOSIS — R0989 Other specified symptoms and signs involving the circulatory and respiratory systems: Secondary | ICD-10-CM | POA: Diagnosis not present

## 2023-01-26 DIAGNOSIS — G4489 Other headache syndrome: Secondary | ICD-10-CM | POA: Diagnosis not present

## 2023-01-26 DIAGNOSIS — J9811 Atelectasis: Secondary | ICD-10-CM | POA: Diagnosis not present

## 2023-01-26 DIAGNOSIS — R0689 Other abnormalities of breathing: Secondary | ICD-10-CM | POA: Diagnosis not present

## 2023-01-26 DIAGNOSIS — R000 Tachycardia, unspecified: Secondary | ICD-10-CM | POA: Diagnosis not present

## 2023-01-26 DIAGNOSIS — R569 Unspecified convulsions: Secondary | ICD-10-CM | POA: Diagnosis not present

## 2023-01-26 DIAGNOSIS — R0902 Hypoxemia: Secondary | ICD-10-CM | POA: Diagnosis not present

## 2023-01-27 DIAGNOSIS — J9811 Atelectasis: Secondary | ICD-10-CM | POA: Diagnosis not present

## 2023-01-27 DIAGNOSIS — R0989 Other specified symptoms and signs involving the circulatory and respiratory systems: Secondary | ICD-10-CM | POA: Diagnosis not present

## 2023-01-27 DIAGNOSIS — R0902 Hypoxemia: Secondary | ICD-10-CM | POA: Diagnosis not present

## 2023-03-06 ENCOUNTER — Ambulatory Visit: Payer: Medicaid Other | Admitting: Family Medicine

## 2023-03-14 ENCOUNTER — Ambulatory Visit: Payer: Medicaid Other | Admitting: Physician Assistant

## 2023-05-16 ENCOUNTER — Ambulatory Visit: Payer: Medicaid Other | Admitting: Family Medicine

## 2023-05-16 NOTE — Progress Notes (Deleted)
New patient visit   Patient: Joseph [name]   DOB: 01/29/1981   42 y.o. Male  MRN: [id number] Visit Date: 05/16/2023  Today's healthcare provider: Sherlyn Hay, DO   No chief complaint on file.  Subjective    Joseph [name] is a 42 y.o. male who presents today as a new patient to establish care.  HPI     Current drug use? Past?  ***  Past Medical History:  Diagnosis Date   Asthma    Bipolar 1 disorder (HCC)    Diabetes mellitus without complication (HCC)    Drug abuse (HCC)    Hypertension    Past Surgical History:  Procedure Laterality Date   APPENDECTOMY     BACK SURGERY     CHOLECYSTECTOMY     Family Status  Relation Name Status   Other  (Not Specified)  No partnership data on file   Family History  Problem Relation Age of Onset   Hypertension Other    Social History   Socioeconomic History   Marital status: Single    Spouse name: Not on file   Number of children: Not on file   Years of education: Not on file   Highest education level: Not on file  Occupational History   Not on file  Tobacco Use   Smoking status: Every Day    Current packs/day: 0.50    Average packs/day: 0.5 packs/day for 15.0 years (7.5 ttl pk-yrs)    Types: Cigarettes   Smokeless tobacco: Current  Substance and Sexual Activity   Alcohol use: No   Drug use: Yes    Types: Cocaine, Marijuana, IV    Comment: Percocets, sniffs heroin   Sexual activity: Not on file  Other Topics Concern   Not on file  Social History Narrative   Not on file   Social Determinants of Health   Financial Resource Strain: Not on file  Food Insecurity: Not on file  Transportation Needs: Not on file  Physical Activity: Not on file  Stress: Not on file  Social Connections: Not on file   Outpatient Medications Prior to Visit  Medication Sig   acetaminophen (TYLENOL) 325 MG tablet Take 2 tablets (650 mg total) by mouth every 6 (six) hours as needed for mild pain (or Fever >/= 101).    amphetamine-dextroamphetamine (ADDERALL) 10 MG tablet Take 10 mg by mouth 2 (two) times daily.   gabapentin (NEURONTIN) 300 MG capsule Take 600 mg by mouth 3 (three) times daily.    levETIRAcetam (KEPPRA) 500 MG tablet Take 1 tablet (500 mg total) by mouth 2 (two) times daily.   levothyroxine (SYNTHROID) 100 MCG tablet Take 1 tablet (100 mcg total) by mouth daily before breakfast. (Patient not taking: Reported on 12/25/2019)   naloxone (NARCAN) nasal spray 4 mg/0.1 mL Use in the event of suspected overdose of heroin or opiates   SUBOXONE 8-2 MG FILM Place 1 Film under the tongue in the morning and at bedtime.    No facility-administered medications prior to visit.   No Known Allergies   There is no immunization history on file for this patient.  Health Maintenance  Topic Date Due   Hepatitis C Screening  Never done   DTaP/Tdap/Td (1 - Tdap) Never done   INFLUENZA VACCINE  Never done   COVID-19 Vaccine (1 - 2023-24 season) Never done   HIV Screening  Completed   HPV VACCINES  Aged Out    Patient Care Team: [facility] as PCP - General  Review of Systems  {Insert previous labs (optional):23779} {See past labs  Heme  Chem  Endocrine  Serology  Results Review (optional):1}   Objective    There were no vitals taken for this visit. {Insert last BP/Wt (optional):23777}{See vitals history (optional):1}   Physical Exam ***  Depression Screen     No data to display         No results found for any visits on 05/16/23.  Assessment & Plan     There are no diagnoses linked to this encounter.   ***  No follow-ups on file.     I discussed the assessment and treatment plan with the patient  The patient was provided an opportunity to ask questions and all were answered. The patient agreed with the plan and demonstrated an understanding of the instructions.   The patient was advised to call back or seek an in-person evaluation if the symptoms worsen or if the  condition fails to improve as anticipated.    Sherlyn Hay, DO  [facility] [phone] (phone) [phone] (fax)  [facility]

## 2023-08-18 ENCOUNTER — Ambulatory Visit
Admission: RE | Admit: 2023-08-18 | Discharge: 2023-08-18 | Disposition: A | Discharge status: Home / Self Care | Payer: Medicaid Other | Source: Ambulatory Visit | Attending: Family Medicine | Admitting: Family Medicine

## 2023-08-18 VITALS — BP 200/102 | HR 92 | Temp 98.4°F | Resp 20 | Ht 75.0 in | Wt 322.1 lb

## 2023-08-18 DIAGNOSIS — J101 Influenza due to other identified influenza virus with other respiratory manifestations: Secondary | ICD-10-CM | POA: Diagnosis not present

## 2023-08-18 DIAGNOSIS — Z1152 Encounter for screening for COVID-19: Secondary | ICD-10-CM | POA: Diagnosis not present

## 2023-08-18 DIAGNOSIS — R062 Wheezing: Secondary | ICD-10-CM | POA: Insufficient documentation

## 2023-08-18 DIAGNOSIS — I160 Hypertensive urgency: Secondary | ICD-10-CM | POA: Diagnosis not present

## 2023-08-18 LAB — RESP PANEL BY RT-PCR (FLU A&B, COVID) ARPGX2
Influenza A by PCR: POSITIVE — AB
Influenza B by PCR: NEGATIVE
SARS Coronavirus 2 by RT PCR: NEGATIVE

## 2023-08-18 NOTE — Discharge Instructions (Addendum)
 Your blood pressure is extremely high (216/121) and with your swelling in your legs recommended ED evaluation.  You also have influenza A.   You have been advised to follow up immediately in the emergency department for concerning signs or symptoms as discussed during your visit. If you declined EMS transport, please have a family member take you directly to the ED at this time. Do not delay.   Based on concerns about condition, if you do not follow up in the ED, you may risk poor outcomes including worsening of condition, delayed treatment and potentially life threatening issues. If you have declined to go to the ED at this time, you should call your PCP immediately to set up a follow up appointment.   Go to ED for red flag symptoms, including; fevers you cannot reduce with Tylenol /Motrin, severe headaches, vision changes, numbness/weakness in part of the body, lethargy, confusion, intractable vomiting, severe dehydration, chest pain, breathing difficulty, severe persistent abdominal or pelvic pain, signs of severe infection (increased redness, swelling of an area), feeling faint or passing out, dizziness, etc. You should especially go to the ED for sudden acute worsening of condition if you do not elect to go at this time.

## 2023-08-18 NOTE — ED Provider Notes (Signed)
 [facility]    CSN: [id number] Arrival date & time: 08/18/23  1046      History   Chief Complaint Chief Complaint  Patient presents with   Cough    Appointment    HPI Joseph [name] is a 43 y.o. male.   HPI  History obtained from the patient and his male partner . [name] presents for cough, chest congestion and fever that started on Friday.  Denies shortness of breath but has been wheezing.  History of asthma.  He does smoke.  Has been taking Mucinex for his symptoms. No vomiting or diarrhea.  Endorses headache, body aches, chills and rhinorrhea.  Of note, his dad has been sick as well.   His male partner is concerned that his legs have been weeping for a while now.  He has some swelling in his lower legs.  Denies any blurry vision or chest pain.  He has been complaining of headache for the last week.      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   GAD (generalized anxiety disorder) 05/23/2022   Morbid obesity with BMI of 50.0-59.9, adult (HCC) 05/23/2022   Laceration of left wrist with tendon involvement 12/08/2019   AKI (acute kidney injury) (HCC) 12/06/2019   Near syncope 12/06/2019   Attention deficit hyperactivity disorder (ADHD), 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 Authorizing Provider  acetaminophen  (TYLENOL ) 325 MG tablet Take 2 tablets (650 mg total) by mouth every 6 (six) hours as needed for mild pain (or Fever >/= 101). 12/08/19   Dickie Begun, MD  amphetamine -dextroamphetamine  (ADDERALL) 10 MG tablet Take 10 mg by mouth 2 (two) times daily. 12/03/19   [provider]  gabapentin  (NEURONTIN ) 300 MG capsule Take 600 mg by mouth 3 (three) times daily.     [provider]  levETIRAcetam  (KEPPRA ) 500 MG tablet Take 1 tablet (500 mg total) by mouth 2 (two) times daily. 12/08/19   Dickie Begun, MD  levothyroxine  (SYNTHROID ) 100 MCG tablet Take 1 tablet (100 mcg total) by mouth daily before breakfast. Patient not taking: Reported on 12/25/2019 12/08/19   Dickie Begun, MD  naloxone  (NARCAN ) nasal spray 4 mg/0.1 mL Use in the event of suspected overdose of heroin or opiates 11/05/16   Lang Dover, MD  SUBOXONE  8-2 MG FILM Place 1 Film under the tongue in the morning and at bedtime.     [provider]    Family History Family History  Problem Relation Age of Onset   Hypertension Other     Social History Social History   Tobacco Use   Smoking status: Every Day  Current packs/day: 0.50    Average packs/day: 0.5 packs/day for 15.0 years (7.5 ttl pk-yrs)    Types: Cigarettes   Smokeless tobacco: Current  Vaping Use   Vaping status: Some Days  Substance Use Topics   Alcohol use: No   Drug use: Yes    Types: Cocaine, Marijuana, IV    Comment: Percocets, sniffs heroin     Allergies   Patient has no known allergies.   Review of Systems Review of Systems: negative unless otherwise stated in HPI.      Physical Exam Triage Vital Signs ED Triage Vitals  Encounter Vitals Group     BP 08/18/23 1108 (S) (!) 216/121     Systolic BP Percentile --      Diastolic BP Percentile --      Pulse Rate 08/18/23 1106 92     Resp 08/18/23 1106 20     Temp 08/18/23 1106 98.4  F (36.9 C)     Temp Source 08/18/23 1106 Oral     SpO2 08/18/23 1106 92 %     Weight 08/18/23 1104 (!) 322 lb 1.5 oz (146.1 kg)     Height 08/18/23 1104 6' 3 (1.905 m)     Head Circumference --      Peak Flow --      Pain Score 08/18/23 1104 7     Pain Loc --      Pain Education --      Exclude from Growth Chart --    No data found.  Updated Vital Signs BP (!) 200/102 (BP Location: Left Arm) Comment: recheck manually Comment (BP Location): thigh cuff  Pulse 92   Temp 98.4 F (36.9 C) (Oral)   Resp 20   Ht 6' 3 (1.905 m)   Wt (!) 146.1 kg   SpO2 92%   BMI 40.26 kg/m   Visual Acuity Right Eye Distance:   Left Eye Distance:   Bilateral Distance:    Right Eye Near:   Left Eye Near:    Bilateral Near:     Physical Exam GEN:     alert, ill- appearing male in no acute distress    HENT:  mucus membranes moist, clear nasal discharge EYES:   pupils equal and reactive, no scleral injection or discharge NECK:  normal ROM, no meningismus   RESP:  no increased work of breathing, coarse breath sounds bilaterally, wheezing throughout CVS:   regular rate and rhythm MSK:    Pitting edema to the knee bilaterally, weeping present Skin:   warm and wet   UC Treatments / Results  Labs (all labs ordered are listed, but only abnormal results are displayed) Labs Reviewed  RESP PANEL BY RT-PCR (FLU A&B, COVID) ARPGX2 - Abnormal; Notable for the following components:      Result Value   Influenza A by PCR POSITIVE (*)    All other components within normal limits    EKG   Radiology No results found.  Procedures Procedures (including critical care time)  Medications Ordered in UC Medications - No data to display  Initial Impression / Assessment and Plan / UC Course  I have reviewed the triage vital signs and the nursing notes.  Pertinent labs & imaging results that were available during my care of the patient were reviewed by me and considered in my medical decision  making (see chart for details).       Pt is a 43 y.o. male who presents for 2 days of respiratory symptoms.  [name] is afebrile here. Satting 92% on room air. Overall pt is non-toxic appearing, well hydrated, without respiratory distress. Pulmonary exam is remarkable for wheezing and coarse breath sounds bilaterally.  COVID and influenza panel obtained by nursing staff.   Patient's blood pressure is 216/121.  EKG obtained showing normal sinus rhythm without evidence of ischemia or ST elevations; personally interpreted by me.  Compared to 12/06/2019 tachycardia has resolved.  After resting a manual blood pressure was obtained and was 200/102.  He reports history of hypertension but has not taken any of his medications in years.  He does not have a primary care doctor either.  Reports taking lisinopril  40 mg before.  Explained to patient that he is best evaluated in the emergency department as he has headache, lower extremity edema, elevated blood pressures with oxygen saturation of 92% while wheezing.   His influenza A test is positive.  His COVID test is negative.  Explained to patient that these are likely not the cause of his elevated blood pressure.  I am concerned that he may have hypertensive cardiomyopathy.  He likely needs a cardiac workup as well.  Recommended ED evaluation and he states he will go after speaking with his dad.  Unsure which hospital system he will go to.  Discussed MDM, treatment plan and plan for follow-up with patient who agrees with plan.     Final Clinical Impressions(s) / UC Diagnoses   Final diagnoses:  Hypertensive urgency  Influenza A  Wheezing     Discharge Instructions      Your blood pressure is extremely high (216/121) and with your swelling in your legs recommended ED evaluation.  You also have influenza A.   You have been advised to follow up immediately in the emergency department for concerning signs or symptoms as discussed during your visit. If  you declined EMS transport, please have a family member take you directly to the ED at this time. Do not delay.   Based on concerns about condition, if you do not follow up in the ED, you may risk poor outcomes including worsening of condition, delayed treatment and potentially life threatening issues. If you have declined to go to the ED at this time, you should call your PCP immediately to set up a follow up appointment.   Go to ED for red flag symptoms, including; fevers you cannot reduce with Tylenol /Motrin, severe headaches, vision changes, numbness/weakness in part of the body, lethargy, confusion, intractable vomiting, severe dehydration, chest pain, breathing difficulty, severe persistent abdominal or pelvic pain, signs of severe infection (increased redness, swelling of an area), feeling faint or passing out, dizziness, etc. You should especially go to the ED for sudden acute worsening of condition if you do not elect to go at this time.      ED Prescriptions   None    PDMP not reviewed this encounter.   Deaven Barron, DO 08/18/23 1301

## 2023-08-18 NOTE — ED Triage Notes (Signed)
 Patient c/o cough, chest congestion and fever that started on Friday.

## 2023-08-18 NOTE — ED Notes (Signed)
 Patient states that he does not have a PCP and has not been on his BP medicine in a year.

## 2023-08-21 ENCOUNTER — Ambulatory Visit: Payer: Medicaid Other | Admitting: Family Medicine

## 2023-10-18 ENCOUNTER — Emergency Department

## 2023-10-18 ENCOUNTER — Observation Stay
Admission: EM | Admit: 2023-10-18 | Discharge: 2023-10-19 | Disposition: A | Discharge status: Home / Self Care | Attending: Internal Medicine | Admitting: Internal Medicine

## 2023-10-18 ENCOUNTER — Other Ambulatory Visit: Payer: Self-pay

## 2023-10-18 DIAGNOSIS — T426X5A Adverse effect of other antiepileptic and sedative-hypnotic drugs, initial encounter: Secondary | ICD-10-CM | POA: Diagnosis not present

## 2023-10-18 DIAGNOSIS — G40909 Epilepsy, unspecified, not intractable, without status epilepticus: Secondary | ICD-10-CM

## 2023-10-18 DIAGNOSIS — F411 Generalized anxiety disorder: Secondary | ICD-10-CM | POA: Diagnosis present

## 2023-10-18 DIAGNOSIS — E119 Type 2 diabetes mellitus without complications: Secondary | ICD-10-CM | POA: Insufficient documentation

## 2023-10-18 DIAGNOSIS — Z6841 Body Mass Index (BMI) 40.0 and over, adult: Secondary | ICD-10-CM | POA: Diagnosis not present

## 2023-10-18 DIAGNOSIS — R0902 Hypoxemia: Secondary | ICD-10-CM

## 2023-10-18 DIAGNOSIS — F1010 Alcohol abuse, uncomplicated: Secondary | ICD-10-CM | POA: Insufficient documentation

## 2023-10-18 DIAGNOSIS — E039 Hypothyroidism, unspecified: Secondary | ICD-10-CM | POA: Diagnosis not present

## 2023-10-18 DIAGNOSIS — E66813 Obesity, class 3: Secondary | ICD-10-CM | POA: Diagnosis not present

## 2023-10-18 DIAGNOSIS — R609 Edema, unspecified: Secondary | ICD-10-CM | POA: Diagnosis not present

## 2023-10-18 DIAGNOSIS — R569 Unspecified convulsions: Principal | ICD-10-CM | POA: Insufficient documentation

## 2023-10-18 DIAGNOSIS — F1090 Alcohol use, unspecified, uncomplicated: Secondary | ICD-10-CM

## 2023-10-18 DIAGNOSIS — R410 Disorientation, unspecified: Secondary | ICD-10-CM | POA: Diagnosis not present

## 2023-10-18 DIAGNOSIS — Z79899 Other long term (current) drug therapy: Secondary | ICD-10-CM | POA: Insufficient documentation

## 2023-10-18 DIAGNOSIS — F17210 Nicotine dependence, cigarettes, uncomplicated: Secondary | ICD-10-CM | POA: Diagnosis not present

## 2023-10-18 DIAGNOSIS — R404 Transient alteration of awareness: Secondary | ICD-10-CM | POA: Diagnosis not present

## 2023-10-18 DIAGNOSIS — R000 Tachycardia, unspecified: Secondary | ICD-10-CM | POA: Diagnosis not present

## 2023-10-18 DIAGNOSIS — I10 Essential (primary) hypertension: Secondary | ICD-10-CM | POA: Diagnosis not present

## 2023-10-18 DIAGNOSIS — E079 Disorder of thyroid, unspecified: Secondary | ICD-10-CM | POA: Diagnosis present

## 2023-10-18 MED ORDER — SODIUM CHLORIDE 0.9 % IV BOLUS
1000.0000 mL | Freq: Once | INTRAVENOUS | Status: AC
  Administered 2023-10-19: 1000 mL via INTRAVENOUS

## 2023-10-18 NOTE — ED Notes (Signed)
 Pt stshe drink 3 beers a day with last drink being last yesterday

## 2023-10-18 NOTE — ED Notes (Signed)
 RAD tech at the bedside

## 2023-10-18 NOTE — ED Provider Notes (Signed)
 [facility] Provider Note    Event Date/Time   First MD Initiated Contact with Patient 10/18/23 2339     (approximate)   History   Seizures   HPI  Joseph [name] is a 43 year old male with history of hypothyroidism, ADHD, anxiety, chronic pain on gabapentin presenting to the emergency department following a seizure-like episode.  Per EMS, they were called out for possible overdose on gabapentin.  On arrival, they were told that the patient had seizure-like activity.  Patient is awake, somnolent but answering some basic questions.  Unable to provide significant history.  I did review an ER visit from June of last year in the [facility] system.  At that time, patient presented with 2 seizure-like episodes.  At that time, he reported that he had been taking too much gabapentin.  He had unremarkable lab work.  He was observed in the emergency department and ultimately returned to baseline and was discharged with plans for outpatient neurology follow-up after discussion with poison control.  I am unable to see any follow-up with neurology in our records.   3:28 AM Additional history obtained from patient significant other, Tonna Corner.  She reports that she saw the patient around 3:30 PM on Friday after work.  He told her that he had taken gabapentin and she noted that he was shaky, sweaty.  While she was in the bathroom she heard a thump another room and found the patient having generalized seizure activity with bleeding in his mouth.  Estimates that this lasted for a few minutes.  Reports that the patient had something similar last year when he was seen in the ER at [facility].  Does not think he has had any seizure-like activity since that time.  Does report daily alcohol use.  Reports he typically drinks a 12 pack of beer a night, was drinking on Friday evening.      Physical Exam   Triage Vital Signs: ED Triage Vitals [10/18/23 2339]  Encounter Vitals Group     BP (!)  138/48     Systolic BP Percentile      Diastolic BP Percentile      Pulse Rate (!) 106     Resp 20     Temp 98.1 F (36.7 C)     Temp Source Oral     SpO2 94 %     Weight      Height      Head Circumference      Peak Flow      Pain Score      Pain Loc      Pain Education      Exclude from Growth Chart     Most recent vital signs: Vitals:   10/19/23 0045 10/19/23 0247  BP: 104/60 (!) 155/81  Pulse: (!) 104 86  Resp: 14 15  Temp:    SpO2: 93% 97%     General: Somnolent but arousable  CV:  Mild tachycardia, good peripheral perfusion Resp:  Unlabored respirations, lung sounds mildly diminished bilaterally, satting in the mid 90s on 4 L Abd:  Nondistended, soft, no appreciable tenderness Neuro:  No facial asymmetry, able to pivot out of EMS stretcher into bed, moving extremity spontaneously, able to tell me his name, some confusion about situation, nonrhythmic frequent jerking movements of multiple extremities.   ED Results / Procedures / Treatments   Labs (all labs ordered are listed, but only abnormal results are displayed) Labs Reviewed  CBC WITH DIFFERENTIAL/PLATELET -  Abnormal; Notable for the following components:      Result Value   WBC 13.6 (*)    Neutro Abs 12.1 (*)    All other components within normal limits  COMPREHENSIVE METABOLIC PANEL - Abnormal; Notable for the following components:   Glucose, Bld 186 (*)    Calcium 8.7 (*)    All other components within normal limits  ACETAMINOPHEN LEVEL - Abnormal; Notable for the following components:   Acetaminophen (Tylenol), Serum <10 (*)    All other components within normal limits  SALICYLATE LEVEL - Abnormal; Notable for the following components:   Salicylate Lvl <7.0 (*)    All other components within normal limits  URINALYSIS, ROUTINE W REFLEX MICROSCOPIC - Abnormal; Notable for the following components:   Color, Urine STRAW (*)    APPearance CLEAR (*)    Hgb urine dipstick SMALL (*)    Protein, ur  100 (*)    All other components within normal limits  CBG MONITORING, ED - Abnormal; Notable for the following components:   Glucose-Capillary 163 (*)    All other components within normal limits  TROPONIN I (HIGH SENSITIVITY) - Abnormal; Notable for the following components:   Troponin I (High Sensitivity) 21 (*)    All other components within normal limits  ETHANOL  MAGNESIUM  URINE DRUG SCREEN, QUALITATIVE ([facility] ONLY)  TROPONIN I (HIGH SENSITIVITY)     EKG EKG independently reviewed interpreted by myself (ER attending) demonstrates:  EKG demonstrates sinus tachycardia at a rate of 101, PR 177, QRS 124, QTc 505, no acute ST changes  RADIOLOGY Imaging independently reviewed and interpreted by myself demonstrates:  CT head without acute bleed CXR without focal consolidation  CTA chest without central PE, but limited evaluation due to contrast timing  PROCEDURES:  Critical Care performed: Yes, see critical care procedure note(s)  CRITICAL CARE Performed by: Trinna Post   Total critical care time: 32 minutes  Critical care time was exclusive of separately billable procedures and treating other patients.  Critical care was necessary to treat or prevent imminent or life-threatening deterioration.  Critical care was time spent personally by me on the following activities: development of treatment plan with patient and/or surrogate as well as nursing, discussions with consultants, evaluation of patient's response to treatment, examination of patient, obtaining history from patient or surrogate, ordering and performing treatments and interventions, ordering and review of laboratory studies, ordering and review of radiographic studies, pulse oximetry and re-evaluation of patient's condition.   Procedures   MEDICATIONS ORDERED IN ED: Medications  potassium chloride 10 mEq in 100 mL IVPB (10 mEq Intravenous New Bag/Given 10/19/23 0322)  LORazepam (ATIVAN) injection 0-4 mg (has no  administration in time range)    Or  LORazepam (ATIVAN) tablet 0-4 mg (has no administration in time range)  LORazepam (ATIVAN) injection 0-4 mg (has no administration in time range)    Or  LORazepam (ATIVAN) tablet 0-4 mg (has no administration in time range)  thiamine (VITAMIN B1) tablet 100 mg (has no administration in time range)    Or  thiamine (VITAMIN B1) injection 100 mg (has no administration in time range)  sodium chloride 0.9 % bolus 1,000 mL (0 mLs Intravenous Stopped 10/19/23 0308)  midazolam PF (VERSED) 10 MG/2ML injection (5 mg  Given 10/19/23 0012)  levETIRAcetam (KEPPRA) IVPB 1500 mg/ 100 mL premix (0 mg Intravenous Stopped 10/19/23 0306)    Followed by  levETIRAcetam (KEPPRA) IVPB 1500 mg/ 100 mL premix (1,500 mg Intravenous New Bag/Given  10/19/23 0301)    Followed by  levETIRAcetam (KEPPRA) IVPB 1500 mg/ 100 mL premix (0 mg Intravenous Stopped 10/19/23 0306)  iohexol (OMNIPAQUE) 350 MG/ML injection 125 mL (125 mLs Intravenous Contrast Given 10/19/23 0127)  potassium chloride SA (KLOR-CON M) CR tablet 40 mEq (40 mEq Oral Given 10/19/23 0242)     IMPRESSION / MDM / ASSESSMENT AND PLAN / ED COURSE  I reviewed the triage vital signs and the nursing notes.  Differential diagnosis includes, but is not limited to, primary seizure disorder, intracranial bleed, arrhythmia, anemia, electrolyte abnormality, drug intoxication or withdrawal  Patient's presentation is most consistent with acute presentation with potential threat to life or bodily function.  44 year old male presenting altered and hypoxic after witnessed seizure-like activity.  Awake but somnolent here.  Appears to had similar presentation a year ago at outside hospital.  Not on AEDs.  Will obtain labs, EKG, CT head, await to see if family can provide collateral.  Clinical Course as of 10/19/23 0328  Sat Oct 19, 2023  0018 Noticed that patient was calling out for assistance.  Went to evaluate patient.  He requested a  urinal.  He was given this, but shortly after was noted to have onset of generalized tonic-clonic activity with diaphoresis, tachycardia.  Episode lasted for approximately 45 seconds.  Given 5 mg of IV Versed.  Initially unresponsive following this episode, but slowly awakening, following commands.  Patient was still slightly somnolent prior to second seizure episode, with this, will give 4 and half grams of Keppra.   [NR]  0236 CT imaging reassuring.  Patient reassessed.  He is now readily awake, answering questions.  He does admit to frequently taking higher doses of gabapentin that he is prescribed.  Thinks he may have taken 5 pills earlier today, but is not sure how much he took for the time he took it.  Case was reviewed with Patty with poison control.  She notes that patient has widened QRS and QTc on EKG that would not be typical for gabapentin.  There are case reports of seizures from gabapentin toxicity, but it is not a common side effect.  Recommends optimization of K and Mag to 4.2 and 2.2. She does recommend that patient be observed for at least 6 hours from his last seizure.  If his mental status normalizes and his hypoxia resolves, can be cleared from a toxicology perspective at that time.  Patient currently remains hypoxic satting in the low 90s on 4 L.  With his ongoing hypoxia and multiple seizure-like episodes, do think he is appropriate for admission.  Will reach out to hospitalist team. [NR]  [phone] Patient adamantly denying any intent of self-harm.  Reports that he uses higher doses of gabapentin recreationally to get high, significant other notes that he has a history of this and denies any known history of suicidal thoughts.  Based on this, do not thing there is an indication for IVC or psychiatry consultation at this time. [NR]  0256 Alcohol, Ethyl (B): <10 EtOH undetectable here.  Patient reportedly drinks daily.  Did not report discontinuing alcohol use recently, but with undetectable  level, consideration for possible withdrawal seizure.  CIWA protocol ordered. [NR]  0306 Case discussed with Dr. Para March, she will evaluate the patient for anticipated admission. [NR]    Clinical Course User Index [NR] Trinna Post, MD     FINAL CLINICAL IMPRESSION(S) / ED DIAGNOSES   Final diagnoses:  Seizure-like activity (HCC)  Adverse effect of gabapentin, initial encounter  Hypoxia     Rx / DC Orders   ED Discharge Orders     None        Note:  This document was prepared using Dragon voice recognition software and may include unintentional dictation errors.   Trinna Post, MD 10/19/23 [phone]

## 2023-10-18 NOTE — ED Triage Notes (Addendum)
 Per ems they was called out initally for possible OD on Gabapentin, but was told he was having seizures. Per ems Pt was told they have hx of seizures but no known medications taken. Pt was confused and hypoxic on scene. 88% on RA, 4 liter 93%

## 2023-10-19 ENCOUNTER — Emergency Department

## 2023-10-19 ENCOUNTER — Other Ambulatory Visit

## 2023-10-19 ENCOUNTER — Observation Stay

## 2023-10-19 DIAGNOSIS — F1090 Alcohol use, unspecified, uncomplicated: Secondary | ICD-10-CM

## 2023-10-19 DIAGNOSIS — G40909 Epilepsy, unspecified, not intractable, without status epilepticus: Secondary | ICD-10-CM

## 2023-10-19 DIAGNOSIS — T426X5A Adverse effect of other antiepileptic and sedative-hypnotic drugs, initial encounter: Secondary | ICD-10-CM

## 2023-10-19 DIAGNOSIS — R0902 Hypoxemia: Secondary | ICD-10-CM

## 2023-10-19 LAB — URINALYSIS, ROUTINE W REFLEX MICROSCOPIC
Bacteria, UA: NONE SEEN
Bilirubin Urine: NEGATIVE
Glucose, UA: NEGATIVE mg/dL
Ketones, ur: NEGATIVE mg/dL
Leukocytes,Ua: NEGATIVE
Nitrite: NEGATIVE
Protein, ur: 100 mg/dL — AB
Specific Gravity, Urine: 1.029 (ref 1.005–1.030)
Squamous Epithelial / HPF: 0 /HPF (ref 0–5)
pH: 5 (ref 5.0–8.0)

## 2023-10-19 LAB — URINE DRUG SCREEN, QUALITATIVE (ARMC ONLY)
Amphetamines, Ur Screen: NOT DETECTED
Barbiturates, Ur Screen: NOT DETECTED
Benzodiazepine, Ur Scrn: POSITIVE — AB
Cannabinoid 50 Ng, Ur ~~LOC~~: POSITIVE — AB
Cocaine Metabolite,Ur ~~LOC~~: NOT DETECTED
MDMA (Ecstasy)Ur Screen: NOT DETECTED
Methadone Scn, Ur: NOT DETECTED
Opiate, Ur Screen: NOT DETECTED
Phencyclidine (PCP) Ur S: NOT DETECTED
Tricyclic, Ur Screen: NOT DETECTED

## 2023-10-19 LAB — CBC WITH DIFFERENTIAL/PLATELET
Abs Immature Granulocytes: 0.05 10*3/uL (ref 0.00–0.07)
Basophils Absolute: 0 10*3/uL (ref 0.0–0.1)
Basophils Relative: 0 %
Eosinophils Absolute: 0.1 10*3/uL (ref 0.0–0.5)
Eosinophils Relative: 0 %
HCT: 47.2 % (ref 39.0–52.0)
Hemoglobin: 15.7 g/dL (ref 13.0–17.0)
Immature Granulocytes: 0 %
Lymphocytes Relative: 7 %
Lymphs Abs: 0.9 10*3/uL (ref 0.7–4.0)
MCH: 29.5 pg (ref 26.0–34.0)
MCHC: 33.3 g/dL (ref 30.0–36.0)
MCV: 88.6 fL (ref 80.0–100.0)
Monocytes Absolute: 0.4 10*3/uL (ref 0.1–1.0)
Monocytes Relative: 3 %
Neutro Abs: 12.1 10*3/uL — ABNORMAL HIGH (ref 1.7–7.7)
Neutrophils Relative %: 90 %
Platelets: 275 10*3/uL (ref 150–400)
RBC: 5.33 MIL/uL (ref 4.22–5.81)
RDW: 13.8 % (ref 11.5–15.5)
WBC: 13.6 10*3/uL — ABNORMAL HIGH (ref 4.0–10.5)
nRBC: 0 % (ref 0.0–0.2)

## 2023-10-19 LAB — COMPREHENSIVE METABOLIC PANEL
ALT: 23 U/L (ref 0–44)
AST: 31 U/L (ref 15–41)
Albumin: 4.1 g/dL (ref 3.5–5.0)
Alkaline Phosphatase: 80 U/L (ref 38–126)
Anion gap: 10 (ref 5–15)
BUN: 13 mg/dL (ref 6–20)
CO2: 27 mmol/L (ref 22–32)
Calcium: 8.7 mg/dL — ABNORMAL LOW (ref 8.9–10.3)
Chloride: 98 mmol/L (ref 98–111)
Creatinine, Ser: 0.86 mg/dL (ref 0.61–1.24)
GFR, Estimated: >60 mL/min (ref >60)
Glucose, Bld: 186 mg/dL — ABNORMAL HIGH (ref 70–99)
Potassium: 3.6 mmol/L (ref 3.5–5.1)
Sodium: 135 mmol/L (ref 135–145)
Total Bilirubin: 0.6 mg/dL (ref 0.0–1.2)
Total Protein: 8.1 g/dL (ref 6.5–8.1)

## 2023-10-19 LAB — HIV ANTIBODY (ROUTINE TESTING W REFLEX): HIV Screen 4th Generation wRfx: NONREACTIVE

## 2023-10-19 LAB — TROPONIN I (HIGH SENSITIVITY)
Troponin I (High Sensitivity): 9 ng/L (ref <18)
Troponin I (High Sensitivity): 21 ng/L — ABNORMAL HIGH (ref <18)

## 2023-10-19 LAB — MAGNESIUM: Magnesium: 2.2 mg/dL (ref 1.7–2.4)

## 2023-10-19 LAB — CBG MONITORING, ED: Glucose-Capillary: 163 mg/dL — ABNORMAL HIGH (ref 70–99)

## 2023-10-19 LAB — TSH: TSH: 2.87 u[IU]/mL (ref 0.350–4.500)

## 2023-10-19 LAB — SALICYLATE LEVEL: Salicylate Lvl: <7 mg/dL — ABNORMAL LOW (ref 7.0–30.0)

## 2023-10-19 LAB — ETHANOL: Alcohol, Ethyl (B): <10 mg/dL (ref <10)

## 2023-10-19 LAB — ACETAMINOPHEN LEVEL: Acetaminophen (Tylenol), Serum: <10 ug/mL — ABNORMAL LOW (ref 10–30)

## 2023-10-19 MED ORDER — ORAL CARE MOUTH RINSE
15.0000 mL | OROMUCOSAL | Status: DC
  Filled 2023-10-19 (×8): qty 15

## 2023-10-19 MED ORDER — IOHEXOL 350 MG/ML SOLN
125.0000 mL | Freq: Once | INTRAVENOUS | Status: AC | PRN
  Administered 2023-10-19: 125 mL via INTRAVENOUS

## 2023-10-19 MED ORDER — LORAZEPAM 2 MG/ML IJ SOLN: 0.0000 mg | Freq: Four times a day (QID) | INTRAMUSCULAR | Status: DC

## 2023-10-19 MED ORDER — GADOBUTROL 1 MMOL/ML IV SOLN
10.0000 mL | Freq: Once | INTRAVENOUS | Status: AC | PRN
  Administered 2023-10-19: 10 mL via INTRAVENOUS

## 2023-10-19 MED ORDER — LEVETIRACETAM IN NACL 500 MG/100ML IV SOLN: 500.0000 mg | Freq: Two times a day (BID) | INTRAVENOUS | Status: DC

## 2023-10-19 MED ORDER — LORAZEPAM 2 MG/ML IJ SOLN: 1.0000 mg | INTRAMUSCULAR | Status: DC | PRN

## 2023-10-19 MED ORDER — RABIES VACCINE, PCEC IM SUSR: 1.0000 mL | Freq: Once | INTRAMUSCULAR | Status: DC

## 2023-10-19 MED ORDER — ORAL CARE MOUTH RINSE: 15.0000 mL | OROMUCOSAL | Status: DC | PRN

## 2023-10-19 MED ORDER — GABAPENTIN 300 MG PO CAPS
800.0000 mg | ORAL_CAPSULE | Freq: Three times a day (TID) | ORAL | Status: DC
  Administered 2023-10-19: 800 mg via ORAL
  Filled 2023-10-19: qty 2

## 2023-10-19 MED ORDER — FOLIC ACID 1 MG PO TABS: 1.0000 mg | ORAL_TABLET | Freq: Every day | ORAL | 1 refills | Status: DC

## 2023-10-19 MED ORDER — GABAPENTIN 300 MG PO CAPS: 1200.0000 mg | ORAL_CAPSULE | Freq: Three times a day (TID) | ORAL | Status: DC

## 2023-10-19 MED ORDER — ONDANSETRON HCL 4 MG/2ML IJ SOLN: 4.0000 mg | Freq: Four times a day (QID) | INTRAMUSCULAR | Status: DC | PRN

## 2023-10-19 MED ORDER — MIDAZOLAM HCL (PF) 10 MG/2ML IJ SOLN
INTRAMUSCULAR | Status: AC
  Administered 2023-10-19: 5 mg
  Filled 2023-10-19: qty 2

## 2023-10-19 MED ORDER — ONDANSETRON HCL 4 MG PO TABS: 4.0000 mg | ORAL_TABLET | Freq: Four times a day (QID) | ORAL | Status: DC | PRN

## 2023-10-19 MED ORDER — ADULT MULTIVITAMIN W/MINERALS CH
1.0000 | ORAL_TABLET | Freq: Every day | ORAL | Status: DC
  Administered 2023-10-19: 1 via ORAL
  Filled 2023-10-19: qty 1

## 2023-10-19 MED ORDER — THIAMINE HCL 100 MG/ML IJ SOLN: 100.0000 mg | Freq: Every day | INTRAMUSCULAR | Status: DC

## 2023-10-19 MED ORDER — ACETAMINOPHEN 325 MG PO TABS: 650.0000 mg | ORAL_TABLET | ORAL | Status: DC | PRN

## 2023-10-19 MED ORDER — VITAMIN B-1 100 MG PO TABS: 100.0000 mg | ORAL_TABLET | Freq: Every day | ORAL | 0 refills | Status: AC

## 2023-10-19 MED ORDER — LEVETIRACETAM IN NACL 1500 MG/100ML IV SOLN
1500.0000 mg | Freq: Once | INTRAVENOUS | Status: AC
  Administered 2023-10-19: 1500 mg via INTRAVENOUS
  Filled 2023-10-19: qty 100

## 2023-10-19 MED ORDER — POTASSIUM CHLORIDE CRYS ER 20 MEQ PO TBCR
40.0000 meq | EXTENDED_RELEASE_TABLET | Freq: Once | ORAL | Status: AC
  Administered 2023-10-19: 40 meq via ORAL
  Filled 2023-10-19: qty 2

## 2023-10-19 MED ORDER — THIAMINE MONONITRATE 100 MG PO TABS: 100.0000 mg | ORAL_TABLET | Freq: Every day | ORAL | Status: DC

## 2023-10-19 MED ORDER — POTASSIUM CHLORIDE 10 MEQ/100ML IV SOLN
10.0000 meq | INTRAVENOUS | Status: AC
  Administered 2023-10-19 (×2): 10 meq via INTRAVENOUS
  Filled 2023-10-19 (×2): qty 100

## 2023-10-19 MED ORDER — LORAZEPAM 2 MG PO TABS: 0.0000 mg | ORAL_TABLET | Freq: Four times a day (QID) | ORAL | Status: DC

## 2023-10-19 MED ORDER — LORAZEPAM 1 MG PO TABS: 1.0000 mg | ORAL_TABLET | ORAL | Status: DC | PRN

## 2023-10-19 MED ORDER — LEVETIRACETAM IN NACL 1000 MG/100ML IV SOLN
INTRAVENOUS | Status: AC
  Filled 2023-10-19: qty 100

## 2023-10-19 MED ORDER — RABIES IMMUNE GLOBULIN 150 UNIT/ML IM INJ
20.0000 [IU]/kg | INJECTION | Freq: Once | INTRAMUSCULAR | Status: DC
  Filled 2023-10-19: qty 20

## 2023-10-19 MED ORDER — ACETAMINOPHEN 650 MG RE SUPP: 650.0000 mg | RECTAL | Status: DC | PRN

## 2023-10-19 MED ORDER — LORAZEPAM 2 MG/ML IJ SOLN: 0.0000 mg | Freq: Two times a day (BID) | INTRAMUSCULAR | Status: DC

## 2023-10-19 MED ORDER — ENOXAPARIN SODIUM 80 MG/0.8ML IJ SOSY
0.5000 mg/kg | PREFILLED_SYRINGE | INTRAMUSCULAR | Status: DC
  Administered 2023-10-19: 72.5 mg via SUBCUTANEOUS
  Filled 2023-10-19 (×2): qty 0.72

## 2023-10-19 MED ORDER — LORAZEPAM 2 MG PO TABS: 0.0000 mg | ORAL_TABLET | Freq: Two times a day (BID) | ORAL | Status: DC

## 2023-10-19 MED ORDER — GABAPENTIN 300 MG PO CAPS: 400.0000 mg | ORAL_CAPSULE | Freq: Every day | ORAL | Status: DC

## 2023-10-19 MED ORDER — SODIUM CHLORIDE 0.9 % IV SOLN: 4500.0000 mg | Freq: Once | INTRAVENOUS | Status: DC

## 2023-10-19 MED ORDER — THIAMINE MONONITRATE 100 MG PO TABS
100.0000 mg | ORAL_TABLET | Freq: Every day | ORAL | Status: DC
  Administered 2023-10-19: 100 mg via ORAL
  Filled 2023-10-19: qty 1

## 2023-10-19 MED ORDER — FOLIC ACID 1 MG PO TABS
1.0000 mg | ORAL_TABLET | Freq: Every day | ORAL | Status: DC
  Administered 2023-10-19: 1 mg via ORAL
  Filled 2023-10-19: qty 1

## 2023-10-19 MED ORDER — GABAPENTIN 400 MG PO CAPS: ORAL_CAPSULE | ORAL | 0 refills | Status: DC

## 2023-10-19 MED ORDER — AMOXICILLIN-POT CLAVULANATE 875-125 MG PO TABS: 1.0000 | ORAL_TABLET | Freq: Once | ORAL | Status: DC

## 2023-10-19 NOTE — Discharge Summary (Signed)
 Physician Discharge Summary   Patient: Joseph [name] MRN: [id number] DOB: 1981/07/09  Admit date:     10/18/2023  Discharge date: 10/19/23  Discharge Physician: Loyce Dys   PCP: [facility]   Recommendations at discharge:  Follow-up with  neurology  Discharge Diagnoses: Recurrent seizures (HCC) History of seizure disorder Adverse reaction to gabapentin, initial encounter Hypoxia Alcohol use disorder HTN (hypertension) Obesity, Class III, BMI 40-49.9 (morbid obesity) [facility])  Hospital Course: Joseph [name] is a 43 y.o. male with medical history significant for hypertension, hypothyroidism, ADHD, anxiety, chronic pain on gabapentin , alcohol use, seizure disorder, history of [facility] ED visit June 2024 for seizure-like activity  discharged with recommendation for neurology follow-up who now presents via EMS for seizure.  Patient was seen by neurologist and MRI of the brain was obtained which did not show any intracranial pathology.  Neurologist recommendations appreciated and dosing of gabapentin adjusted upward.  Keppra not recommended by neurologist given patient's situation.  Neurologist recommending treating his seizure with increased dosing of gabapentin as outlined in the neurologist recommendation today.  Patient is feeling great and therefore being discharged home to follow-up with neurologist  Consultants: Neurology Procedures performed: None Disposition: Home Diet recommendation:  Cardiac diet DISCHARGE MEDICATION: Allergies as of 10/19/2023   No Known Allergies      Medication List     TAKE these medications    ALPRAZolam 0.5 MG tablet Commonly known as: XANAX Take 0.5 mg by mouth at bedtime.   amphetamine-dextroamphetamine 10 MG tablet Commonly known as: ADDERALL Take 10 mg by mouth 2 (two) times daily as needed.   buprenorphine-naloxone 8-2 mg Subl SL tablet Commonly known as: SUBOXONE Place 1 tablet under the tongue 3 (three) times daily.    DULoxetine 60 MG capsule Commonly known as: CYMBALTA Take 60 mg by mouth daily.   folic acid 1 MG tablet Commonly known as: FOLVITE Take 1 tablet (1 mg total) by mouth daily. Start taking on: October 20, 2023   gabapentin 800 MG tablet Commonly known as: NEURONTIN Take 800 mg by mouth 3 (three) times daily. What changed: Another medication with the same name was changed. Make sure you understand how and when to take each.   gabapentin 400 MG capsule Commonly known as: NEURONTIN Take 400mg  at night for 1 week and then add 400mg  to your afternoon dosing for 1 week and then add 400mg  to your morning dosing.  Making a total of 1200mg  3 times daily going forward. What changed:  medication strength how much to take how to take this when to take this additional instructions   naloxone 4 MG/0.1ML Liqd nasal spray kit Commonly known as: NARCAN Use in the event of suspected overdose of heroin or opiates What changed:  how much to take how to take this when to take this   thiamine 100 MG tablet Commonly known as: Vitamin B-1 Take 1 tablet (100 mg total) by mouth daily. Start taking on: October 20, 2023        Follow-up Information     Lonell Face, MD. Call.   Specialty: Neurology Contact information: [phone] [facility] [address] [facility] [place] [address] [phone]                Discharge Exam: Ceasar Mons Weights   10/18/23 2349  Weight: (!) 146 kg   Constitutional: Patient alert and oriented x 3 HENT:     Head: Normocephalic and atraumatic.  Cardiovascular:  Rate and Rhythm: Normal rate and regular rhythm.     Heart sounds: Normal heart sounds.  Pulmonary:     Effort: Pulmonary effort is normal.     Breath sounds: Normal breath sounds.  Abdominal:     Palpations: Abdomen is soft.     Tenderness: There is no abdominal tenderness.  Neurological:     General: No focal deficit present.     Condition at discharge: good  The  results of significant diagnostics from this hospitalization (including imaging, microbiology, ancillary and laboratory) are listed below for reference.   Imaging Studies: MR BRAIN W WO CONTRAST Result Date: 10/19/2023 CLINICAL DATA:  Provided history: Seizure, new onset, no history of trauma. EXAM: MRI HEAD WITHOUT AND WITH CONTRAST TECHNIQUE: Multiplanar, multiecho pulse sequences of the brain and surrounding structures were obtained without and with intravenous contrast. CONTRAST:  10mL GADAVIST GADOBUTROL 1 MMOL/ML IV SOLN COMPARISON:  Head CT 10/19/2023. FINDINGS: Intermittently motion degraded examination. Most notably, the axial FLAIR sequence is severely motion degraded, the coronal T2 sequence through the hippocampi is moderately motion degraded, the coronal FLAIR sequence through the hippocampi is severely motion degraded, the axial T1-weighted post-contrast sequence is severely motion degraded, the coronal T1-weighted post-contrast sequence is moderate-to-severely motion degraded and the sagittal T1-weighted post-contrast sequence is moderate-to-severely motion degraded. Within this limitation, findings are as follows. Brain: Cerebral volume is normal. No cortical encephalomalacia is identified. No appreciable hippocampal size or signal asymmetry. There is no acute infarct. No evidence of an intracranial mass. No chronic intracranial blood products. No extra-axial fluid collection. No midline shift. No pathologic intracranial enhancement identified. Vascular: Maintained flow voids within the proximal large arterial vessels. Skull and upper cervical spine: No focal worrisome marrow lesion. Sinuses/Orbits: No mass or acute finding within the imaged orbits. Minimal mucosal thickening within the right maxillary sinus. Small mucous retention cyst, and minimal background mucosal thickening, within the left maxillary sinus. Mucosal thickening within the bilateral sphenoid sinuses (trace right, moderate  left). Mild-to-moderate mucosal thickening within the bilateral ethmoid sinuses. Other: Trace fluid within the bilateral mastoid air cells. IMPRESSION: 1. Significantly motion degraded examination, limiting evaluation. Consider a repeat examination when the patient is better able to tolerate the study. 2. Within the limitations of significant motion degradation, there is no evidence of an acute intracranial abnormality and no specific seizure etiology is identified. 3. Paranasal sinus disease as described. 4. Trace fluid within bilateral mastoid air cells. Electronically Signed   By: Jackey Loge D.O.   On: 10/19/2023 10:56   CT Angio Chest PE W and/or Wo Contrast Result Date: 10/19/2023 CLINICAL DATA:  Pulmonary embolism (PE) suspected, high prob. Seizures. Possible overdose on gabapentin. Confused and hypoxic. EXAM: CT ANGIOGRAPHY CHEST WITH CONTRAST TECHNIQUE: Multidetector CT imaging of the chest was performed using the standard protocol during bolus administration of intravenous contrast. Multiplanar CT image reconstructions and MIPs were obtained to evaluate the vascular anatomy. RADIATION DOSE REDUCTION: This exam was performed according to the departmental dose-optimization program which includes automated exposure control, adjustment of the mA and/or kV according to patient size and/or use of iterative reconstruction technique. CONTRAST:  OMNIPAQUE IOHEXOL 350 MG/ML SOLN COMPARISON:  Chest x-ray 10/19/2023, CT angiography chest 09/01/2015. FINDINGS: Cardiovascular: Poor opacification of the pulmonary arteries to the segmental level. No evidence of central pulmonary embolism. Limited evaluation more distally due to timing of contrast and body habitus. Normal heart size. No pericardial effusion. Mediastinum/Nodes: No enlarged mediastinal, hilar, or axillary lymph nodes. Thyroid gland, trachea, and  esophagus demonstrate no significant findings. Lungs/Pleura: No focal consolidation. No pulmonary  nodule. No pulmonary mass. No pleural effusion. No pneumothorax. Upper Abdomen: Status post cholecystectomy.  No acute abnormality. Musculoskeletal: No chest wall abnormality. No suspicious lytic or blastic osseous lesions. No acute displaced fracture. Review of the MIP images confirms the above findings. IMPRESSION: 1. No central pulmonary embolism. Limited evaluation more distally due to timing of contrast and body habitus. 2.  No acute intrathoracic abnormality. Electronically Signed   By: Tish Frederickson M.D.   On: 10/19/2023 01:36   CT Head Wo Contrast Result Date: 10/19/2023 CLINICAL DATA:  Seizure EXAM: CT HEAD WITHOUT CONTRAST TECHNIQUE: Contiguous axial images were obtained from the base of the skull through the vertex without intravenous contrast. RADIATION DOSE REDUCTION: This exam was performed according to the departmental dose-optimization program which includes automated exposure control, adjustment of the mA and/or kV according to patient size and/or use of iterative reconstruction technique. COMPARISON:  04/29/2016 FINDINGS: Brain: No mass,hemorrhage or extra-axial collection. Normal appearance of the parenchyma and CSF spaces. Vascular: No hyperdense vessel or unexpected vascular calcification. Skull: The visualized skull base, calvarium and extracranial soft tissues are normal. Sinuses/Orbits: No fluid levels or advanced mucosal thickening of the visualized paranasal sinuses. No mastoid or middle ear effusion. Normal orbits. Other: None. IMPRESSION: Normal head CT. Electronically Signed   By: Deatra Robinson M.D.   On: 10/19/2023 01:32   DG Chest Port 1 View Result Date: 10/19/2023 CLINICAL DATA:  Hypoxia EXAM: PORTABLE CHEST 1 VIEW COMPARISON:  09/01/2015 FINDINGS: Heart and mediastinal contours are within normal limits. No focal opacities or effusions. No acute bony abnormality. IMPRESSION: No active cardiopulmonary disease. Electronically Signed   By: Charlett Nose M.D.   On: 10/19/2023  00:21    Microbiology: Results for orders placed or performed during the hospital encounter of 08/18/23  Resp Panel by RT-PCR (Flu A&B, Covid) Anterior Nasal Swab     Status: Abnormal   Collection Time: 08/18/23 11:09 AM   Specimen: Anterior Nasal Swab  Result Value Ref Range Status   SARS Coronavirus 2 by RT PCR NEGATIVE NEGATIVE Final    Comment: (NOTE) SARS-CoV-2 target nucleic acids are NOT DETECTED.  The SARS-CoV-2 RNA is generally detectable in upper respiratory specimens during the acute phase of infection. The lowest concentration of SARS-CoV-2 viral copies this assay can detect is 138 copies/mL. A negative result does not preclude SARS-Cov-2 infection and should not be used as the sole basis for treatment or other patient management decisions. A negative result may occur with  improper specimen collection/handling, submission of specimen other than nasopharyngeal swab, presence of viral mutation(s) within the areas targeted by this assay, and inadequate number of viral copies(<138 copies/mL). A negative result must be combined with clinical observations, patient history, and epidemiological information. The expected result is Negative.  Fact Sheet for Patients:  BloggerCourse.com  Fact Sheet for Healthcare Providers:  SeriousBroker.it  This test is no t yet approved or cleared by the [place] FDA and  has been authorized for detection and/or diagnosis of SARS-CoV-2 by FDA under an Emergency Use Authorization (EUA). This EUA will remain  in effect (meaning this test can be used) for the duration of the COVID-19 declaration under Section 564(b)(1) of the Act, 21 U.S.C.section 360bbb-3(b)(1), unless the authorization is terminated  or revoked sooner.       Influenza A by PCR POSITIVE (A) NEGATIVE Final   Influenza B by PCR NEGATIVE NEGATIVE Final    Comment: (  NOTE) The Xpert Xpress SARS-CoV-2/FLU/RSV plus assay  is intended as an aid in the diagnosis of influenza from Nasopharyngeal swab specimens and should not be used as a sole basis for treatment. Nasal washings and aspirates are unacceptable for Xpert Xpress SARS-CoV-2/FLU/RSV testing.  Fact Sheet for Patients: BloggerCourse.com  Fact Sheet for Healthcare Providers: SeriousBroker.it  This test is not yet approved or cleared by the [place] FDA and has been authorized for detection and/or diagnosis of SARS-CoV-2 by FDA under an Emergency Use Authorization (EUA). This EUA will remain in effect (meaning this test can be used) for the duration of the COVID-19 declaration under Section 564(b)(1) of the Act, 21 U.S.C. section 360bbb-3(b)(1), unless the authorization is terminated or revoked.  Performed at [facility], [address] [place], [place] [address]     Labs: CBC: Recent Labs  Lab 10/18/23 2347  WBC 13.6*  NEUTROABS 12.1*  HGB 15.7  HCT 47.2  MCV 88.6  PLT 275   Basic Metabolic Panel: Recent Labs  Lab 10/18/23 2347 10/19/23 0001  NA 135  --   K 3.6  --   CL 98  --   CO2 27  --   GLUCOSE 186*  --   BUN 13  --   CREATININE 0.86  --   CALCIUM 8.7*  --   MG  --  2.2   Liver Function Tests: Recent Labs  Lab 10/18/23 2347  AST 31  ALT 23  ALKPHOS 80  BILITOT 0.6  PROT 8.1  ALBUMIN 4.1   CBG: Recent Labs  Lab 10/19/23 0016  GLUCAP 163*    Discharge time spent:  34 minutes.  Signed: Loyce Dys, MD [facility] 10/19/2023

## 2023-10-19 NOTE — Assessment & Plan Note (Signed)
 History of seizure disorder Differential includes seizure provoked by gabapentin overuse versus EtOH withdrawal Ativan as needed seizure Patient was loaded with Keppra in the ED Continue Keppra EEG Neurology consult

## 2023-10-19 NOTE — ED Notes (Signed)
 Breakfast tray provided.

## 2023-10-19 NOTE — Assessment & Plan Note (Addendum)
 Patient drinks a 12 pack of beer daily. EtOH less than 10 CIWA withdrawal protocol Thiamine folate and multivitamin

## 2023-10-19 NOTE — ED Notes (Addendum)
 Pt is alert and oriented x4 and talking on the phone about his job. Pt stated," A black doctor told me that she want to observe me for a few more hours then I can go home". Pt was informed until then it is highly recommended.

## 2023-10-19 NOTE — ED Notes (Signed)
 Patty from poison control called and recommended a repeat EKG.

## 2023-10-19 NOTE — Consult Note (Signed)
 NEUROLOGY CONSULT NOTE   Date of service: October 19, 2023 Patient Name: Joseph [name] MRN:  [id number] DOB:  January 08, 1981 Chief Complaint: "seizure" Requesting Provider: Loyce Dys, MD  History of Present Illness  ALEXXANDER KURT is a 43 y.o. male with hx of daily drinking 12 beers per day, BMI 40.23, hypertension, ADHD intermittently using stimulants, anxiety, hypothyroidism nonadherent to medications, concern for sleep apnea without formal diagnosis, chronic pain on Suboxone, neuropathy on gabapentin  He was in his usual state of health on Friday, had taken his gabapentin as normal but then felt generally unwell sweaty/bad.  He initially attributed this to the house being too hot but even when the home cooled down he continued to feel hot and unwell.  He was then walking a few steps and subsequently fell and was helped up by family, who confirmed that he was having a hard time walking.  He also seemed confused.  Subsequently he was in the bathroom and was heard to have a fall.  His significant other came into the bathroom and found him having generalized convulsions with tongue bite and he was brought to the ED for further evaluation  He had 1 prior witnessed seizure event, with a similar episode occurring in June 2024. During that episode, he initially hollered and turned his head to the right then started having generalized tonic-clonic activity. He did not bite his tongue during that incident, but did have a second seizure with EMS and route to the hospital.  He is currently taking gabapentin, which is noted to help with his pain. There is a concern that he might be using it excessively at times.  He has a history of alcohol use, and although he was drinking the night of the seizure, his blood alcohol level was zero, indicating possible component of withdrawal.  He also uses marijuana daily  There are concerns about his sleep, as he sometimes stops breathing for a few minutes during sleep,  and his oxygen levels have dropped into the eighties in the past.  He has been experiencing leg swelling which they have been trying to manage with compression stockings and has a doctor's appointment scheduled for Monday.  There has been concern of gabapentin toxicity in the past, on review of the chart this has been in the setting of significant AKI with myoclonus described.  No myoclonus on my exam today  On review of the chart, he was started on Keppra back in 2017 after an ED visit for unwitnessed fall with concern for possible seizure at that time but he does not take this medication regularly  He takes gabapentin for chronic pain / neuropathy, with a regimen of 800 mg in the morning and at lunchtime, and 900 mg in the evening. He also takes lorazepam 0.5 mg nightly, Suboxone three times a day, and duloxetine. He has been out of his levothyroxine for hypothyroidism for the past year has not taken Adderall recently due to side effects of feeling "weird" on it.  Spell #  - Semiology: Generalized tonic-clonic seizure, June 2024 episode with witnessed head turn to the right initially - Prodome: Feeling generally unwell, hot - Post-spell: Confusion, agitation - Triggers: Unclear - Frequency: 2 witnessed events per family/chart but also a possible event in 2017 based on chart review  Risk factors:  Birth and development: Normal Febrile seizures in childhood: Negative Significant head trauma: At least 1 mild concussion with several other more minor head traumas, total of approximately 10 Intracranial  surgeries: Negative Mengingitis/Encephalitis history: Negative Family history of seizures or developmental delay: Negative     ROS  Comprehensive ROS performed and pertinent positives documented in HPI    Past History   Past Medical History:  Diagnosis Date   Asthma    Bipolar 1 disorder (HCC)    Diabetes mellitus without complication (HCC)    Drug abuse (HCC)    Hypertension      Past Surgical History:  Procedure Laterality Date   APPENDECTOMY     BACK SURGERY     CHOLECYSTECTOMY      Family History: Family History  Problem Relation Age of Onset   Hypertension Other     Social History  reports that he has been smoking cigarettes. He has a 7.5 pack-year smoking history. He uses smokeless tobacco. He reports current drug use. Drugs: Cocaine, Marijuana, and IV. He reports that he does not drink alcohol.  No Known Allergies  Medications   Current Facility-Administered Medications:    acetaminophen (TYLENOL) tablet 650 mg, 650 mg, Oral, Q4H PRN **OR** acetaminophen (TYLENOL) suppository 650 mg, 650 mg, Rectal, Q4H PRN, Andris Baumann, MD   enoxaparin (LOVENOX) injection 72.5 mg, 0.5 mg/kg, Subcutaneous, Q24H, Lindajo Royal V, MD, 72.5 mg at 10/19/23 1610   folic acid (FOLVITE) tablet 1 mg, 1 mg, Oral, Daily, Lindajo Royal V, MD, 1 mg at 10/19/23 9604   levETIRAcetam (KEPPRA) IVPB 500 mg/100 mL premix, 500 mg, Intravenous, Q12H, Andris Baumann, MD   LORazepam (ATIVAN) injection 0-4 mg, 0-4 mg, Intravenous, Q6H **OR** LORazepam (ATIVAN) tablet 0-4 mg, 0-4 mg, Oral, Q6H, Ray, Neha, MD   [START ON 10/21/2023] LORazepam (ATIVAN) injection 0-4 mg, 0-4 mg, Intravenous, Q12H **OR** [START ON 10/21/2023] LORazepam (ATIVAN) tablet 0-4 mg, 0-4 mg, Oral, Q12H, Ray, Neha, MD   LORazepam (ATIVAN) injection 1 mg, 1 mg, Intravenous, Q5 Min x 2 PRN, Andris Baumann, MD   LORazepam (ATIVAN) tablet 1-4 mg, 1-4 mg, Oral, Q1H PRN **OR** LORazepam (ATIVAN) injection 1-4 mg, 1-4 mg, Intravenous, Q1H PRN, Andris Baumann, MD   multivitamin with minerals tablet 1 tablet, 1 tablet, Oral, Daily, Andris Baumann, MD, 1 tablet at 10/19/23 0926   ondansetron (ZOFRAN) tablet 4 mg, 4 mg, Oral, Q6H PRN **OR** ondansetron (ZOFRAN) injection 4 mg, 4 mg, Intravenous, Q6H PRN, Andris Baumann, MD   Oral care mouth rinse, 15 mL, Mouth Rinse, Q2H, Andris Baumann, MD   Oral care mouth rinse, 15  mL, Mouth Rinse, PRN, Andris Baumann, MD   thiamine (VITAMIN B1) tablet 100 mg, 100 mg, Oral, Daily, 100 mg at 10/19/23 0926 **OR** thiamine (VITAMIN B1) injection 100 mg, 100 mg, Intravenous, Daily, Andris Baumann, MD  Current Outpatient Medications:    acetaminophen (TYLENOL) 325 MG tablet, Take 2 tablets (650 mg total) by mouth every 6 (six) hours as needed for mild pain (or Fever >/= 101)., Disp:  , Rfl:    amphetamine-dextroamphetamine (ADDERALL) 10 MG tablet, Take 10 mg by mouth 2 (two) times daily., Disp: , Rfl:    gabapentin (NEURONTIN) 300 MG capsule, Take 600 mg by mouth 3 (three) times daily. , Disp: , Rfl:    levETIRAcetam (KEPPRA) 500 MG tablet, Take 1 tablet (500 mg total) by mouth 2 (two) times daily., Disp: 602 tablet, Rfl: 2   levothyroxine (SYNTHROID) 100 MCG tablet, Take 1 tablet (100 mcg total) by mouth daily before breakfast. (Patient not taking: Reported on 12/25/2019), Disp: , Rfl:    naloxone (NARCAN)  nasal spray 4 mg/0.1 mL, Use in the event of suspected overdose of heroin or opiates, Disp: 1 kit, Rfl: o   SUBOXONE 8-2 MG FILM, Place 1 Film under the tongue in the morning and at bedtime. , Disp: , Rfl:   Vitals   Vitals:   10/19/23 0728 10/19/23 0800 10/19/23 0824 10/19/23 0930  BP: (!) 163/103 (!) 146/70 (!) 146/70 138/81  Pulse: 70 66 66 75  Resp: 17 14  18   Temp:      TempSrc:      SpO2: 97% 93%  96%  Weight:      Height:        Body mass index is 40.23 kg/m.  Physical Exam   Constitutional: Well appearing, in no acute distress Psych: Affect appropriate to situation, pleasant and cooperative Eyes: No scleral injection.  HENT: No OP obstruction.  Incompletely visualized uvula Head: Normocephalic.  Cardiovascular: Normal rate and regular rhythm.  Respiratory: Effort normal, non-labored breathing.  GI: Soft.  No distension.  Skin: Some bruising of the bilateral knees.  Edema of the bilateral lower legs, with some erythema/chronic skin  changes  Neurologic Examination    Neuro: Mental Status: Patient is awake, alert, oriented to person, place, month, year, and situation. Patient is able to give a clear and coherent history. No signs of aphasia or neglect Cranial Nerves: II: Visual Fields are full. Pupils are equal, round, and reactive to light.   III,IV, VI: EOMI without ptosis or diploplia.  V: Facial sensation is symmetric to temperature VII: Facial movement is symmetric.  VIII: hearing is intact to voice and symmetric on tuning fork application to the frontal bone XI: Shoulder shrug is symmetric. XII: tongue is midline without atrophy or fasciculations.  Motor: Tone is normal. Bulk is normal. 5/5 strength was present in all four extremities, except for slight pain limitation of the left deltoid and right lower extremity hip flexion 4+/5 in knee flexion 4+/5 Sensory: Sensation is symmetric to light touch and temperature in the arms and legs.  There is a length dependent loss of temperature sensation in the legs but not arms Cerebellar: FNF intact bilaterally.  Heel-to-shin intact on the left, toe to finger intact on the right (heel-to-shin limited due to knee pain)    Labs/Imaging/Neurodiagnostic studies   CBC:  Recent Labs  Lab 10-30-23 2347  WBC 13.6*  NEUTROABS 12.1*  HGB 15.7  HCT 47.2  MCV 88.6  PLT 275   Basic Metabolic Panel:  Lab Results  Component Value Date   NA 135 2023-10-30   K 3.6 10/30/2023   CO2 27 2023-10-30   GLUCOSE 186 (H) 10/30/23   BUN 13 Oct 30, 2023   CREATININE 0.86 Oct 30, 2023   CALCIUM 8.7 (L) 10/30/2023   GFRNONAA >60 10/30/2023   GFRAA >60 12/25/2019   Lipid Panel: No results found for: "LDLCALC" HgbA1c: No results found for: "HGBA1C"  Urine Drug Screen:     Component Value Date/Time   LABOPIA NONE DETECTED 10/19/2023 0003   COCAINSCRNUR NONE DETECTED 10/19/2023 0003   LABBENZ POSITIVE (A) 10/19/2023 0003   AMPHETMU NONE DETECTED 10/19/2023 0003   THCU  POSITIVE (A) 10/19/2023 0003   LABBARB NONE DETECTED 10/19/2023 0003    Alcohol Level     Component Value Date/Time   ETH <10 October 30, 2023 2347   Head CT negative for acute intracranial process, personally reviewed  MRI Brain(Personally reviewed): 1. Significantly motion degraded examination, limiting evaluation. Consider a repeat examination when the patient is better able to tolerate  the study. 2. Within the limitations of significant motion degradation, there is no evidence of an acute intracranial abnormality and no specific seizure etiology is identified. 3. Paranasal sinus disease as described. 4. Trace fluid within bilateral mastoid air cells.   CT chest PE protocol 1. No central pulmonary embolism. Limited evaluation more distally due to timing of contrast and body habitus. 2.  No acute intrathoracic abnormality.   ASSESSMENT   OZELL JUHASZ is a 43 y.o. male presenting with second lifetime seizure.  Given the confusion and ataxia described suspect he may have had an unwitnessed seizure prior to the witnessed event.  Do not think gabapentin toxicity is playing a role today.  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  However cannot rule out alcohol withdrawal playing some role in his symptoms given undetectable alcohol level on arrival within 1 to 2 hours of finishing drinking per patient's report.  Additionally we discussed the potential for benzodiazepine withdrawal compounding his symptoms although again he reports no missed doses other than missing the dose he would have taken last night at bedtime.  He is not taking the Keppra and given he has significant anxiety I would not use this medication.  Would start by maximizing gabapentin given that he finds this medication also useful for his pain.  We discussed that this may be associated  with the jerking activity if he develops kidney injury again in the future and therefore he should see a physician for blood checks etc. if he experiences myoclonus again in the future. Given his CrCl >79 his max daily dose of gabapentin would be 3600 mg daily  RECOMMENDATIONS  -MRI brain with and without contrast, reviewed, motion limited but negative within these limits -Increase gabapentin with an additional 400 mg nightly for 1 week, then add 400 mg to his midday dose as well as his nightly dose for 1 week, then add 400 mg 3 times daily going forward to gradually achieve a total daily dose of 3600 mg/day (1200 mg 3 times daily) -Seizure precautions reviewed with patient and family and included in discharge instructions, especially stressed alcohol abstinence and driving restrictions -Will need work modifications as well due to the seizure (no operating equipment for 6 months) -Patient prefers outpatient follow-up with [facility] neurology, should be given number to call for appointment [phone] -Outpatient EEG as this is not available inpatient over the weekend -Strongly recommend sleep apnea testing as poor sleep from untreated sleep apnea may lower his seizure threshold -Inpatient neurology will sign off at this time, but please do not hesitate to reach out if additional questions or concerns arise ______________________________________________________________________  Brooke Dare MD-PhD [facility] [phone] [facility] coverage for [facility] is from 8 AM to 4 AM in-house and 4 PM to 8 PM by telephone/video. 8 PM to 8 AM emergent questions or overnight urgent questions should be addressed to Teleneurology On-call or [facility] neurohospitalist; contact information can be found on AMION  Discussed with Dr. Meriam Sprague in person and via secure chat

## 2023-10-19 NOTE — H&P (Signed)
 History and Physical    Patient: Joseph [name] EAV:[id number] DOB: 11-13-1980 DOA: 10/18/2023 DOS: the patient was seen and examined on 10/19/2023 PCP: [facility]  Patient coming from: Home  Chief Complaint:  Chief Complaint  Patient presents with   Seizures    HPI: Joseph [name] is a 43 y.o. male with medical history significant for hypertension, hypothyroidism, ADHD, anxiety, chronic pain on gabapentin , alcohol use, seizure disorder, history of [facility] ED visit June 2024 for seizure-like activity Attributed to gabapentin overuse, discharged with recommendation for neurology follow-up who now presents via EMS for seizure after being called out for possible gabapentin overdose.  History taken from ED provider who spoke with patient's significant other who said patient reported that he had taken gabapentin and was noticed to be shaky and sweaty.  Later on she heard a thump in the bathroom and saw him seizing on the floor and bleeding from his mouth.  He had another witnessed seizure following arrival to the ED.  Poison control was called and they recommended observing patient for 6 hours following the seizure in the ED. Additional ED workup: Patient was hypoxic requiring 4 L to maintain sats in the low 90s and had otherwise unremarkable vitals. Labs notable for UDS positive for cannabis and benzos, WBC 13,000 and troponin 21. EtOH less than 10 Urinalysis without infection CT head normal no CTA PE protocol with no central PE or acute intrathoracic abnormality EKG with sinus tachycardia at 101  Patient was treated with Ativan for witnessed seizure.  Also given a Keppra +1500 mg, NS bolus, thiamine  Hospitalist consulted for admission.  Patient somnolent on my assessment likely related to Ativan.  He was arousable.  He states he took three 300 mg Neurontin tablets    Past Medical History:  Diagnosis Date   Asthma    Bipolar 1 disorder (HCC)    Diabetes mellitus without  complication (HCC)    Drug abuse (HCC)    Hypertension    Past Surgical History:  Procedure Laterality Date   APPENDECTOMY     BACK SURGERY     CHOLECYSTECTOMY     Social History:  reports that he has been smoking cigarettes. He has a 7.5 pack-year smoking history. He uses smokeless tobacco. He reports current drug use. Drugs: Cocaine, Marijuana, and IV. He reports that he does not drink alcohol.  No Known Allergies  Family History  Problem Relation Age of Onset   Hypertension Other     Prior to Admission medications   Medication Sig Start Date End Date Taking? Authorizing Provider  acetaminophen (TYLENOL) 325 MG tablet Take 2 tablets (650 mg total) by mouth every 6 (six) hours as needed for mild pain (or Fever >/= 101). 12/08/19   Lynn Ito, MD  amphetamine-dextroamphetamine (ADDERALL) 10 MG tablet Take 10 mg by mouth 2 (two) times daily. 12/03/19   [provider]  gabapentin (NEURONTIN) 300 MG capsule Take 600 mg by mouth 3 (three) times daily.     [provider]  levETIRAcetam (KEPPRA) 500 MG tablet Take 1 tablet (500 mg total) by mouth 2 (two) times daily. 12/08/19   Lynn Ito, MD  levothyroxine (SYNTHROID) 100 MCG tablet Take 1 tablet (100 mcg total) by mouth daily before breakfast. Patient not taking: Reported on 12/25/2019 12/08/19   Lynn Ito, MD  naloxone [facility]) nasal spray 4 mg/0.1 mL Use in the event of suspected overdose of heroin or opiates 11/05/16   Willy Eddy, MD  SUBOXONE  8-2 MG FILM Place 1 Film under the tongue in the morning and at bedtime.     [provider]    Physical Exam: Vitals:   10/19/23 0000 10/19/23 0045 10/19/23 0247 10/19/23 0400  BP: 121/79 104/60 (!) 155/81 (!) 146/77  Pulse: 99 (!) 104 86 78  Resp: 18 14 15    Temp:      TempSrc:      SpO2: 92% 93% 97% 93%  Weight:      Height:       Physical Exam Vitals and nursing note reviewed.  Constitutional:      General: He is not in acute distress.     Comments: Somnolent, will arouse but readily falls back asleep  HENT:     Head: Normocephalic and atraumatic.  Cardiovascular:     Rate and Rhythm: Normal rate and regular rhythm.     Heart sounds: Normal heart sounds.  Pulmonary:     Effort: Pulmonary effort is normal.     Breath sounds: Normal breath sounds.  Abdominal:     Palpations: Abdomen is soft.     Tenderness: There is no abdominal tenderness.  Neurological:     General: No focal deficit present.     Labs on Admission: I have personally reviewed following labs and imaging studies  CBC: Recent Labs  Lab 10/18/23 2347  WBC 13.6*  NEUTROABS 12.1*  HGB 15.7  HCT 47.2  MCV 88.6  PLT 275   Basic Metabolic Panel: Recent Labs  Lab 10/18/23 2347 10/19/23 0001  NA 135  --   K 3.6  --   CL 98  --   CO2 27  --   GLUCOSE 186*  --   BUN 13  --   CREATININE 0.86  --   CALCIUM 8.7*  --   MG  --  2.2   GFR: Estimated Creatinine Clearance: 174.4 mL/min (by C-G formula based on SCr of 0.86 mg/dL). Liver Function Tests: Recent Labs  Lab 10/18/23 2347  AST 31  ALT 23  ALKPHOS 80  BILITOT 0.6  PROT 8.1  ALBUMIN 4.1   No results for input(s): "LIPASE", "AMYLASE" in the last 168 hours. No results for input(s): "AMMONIA" in the last 168 hours. Coagulation Profile: No results for input(s): "INR", "PROTIME" in the last 168 hours. Cardiac Enzymes: No results for input(s): "CKTOTAL", "CKMB", "CKMBINDEX", "TROPONINI" in the last 168 hours. BNP (last 3 results) No results for input(s): "PROBNP" in the last 8760 hours. HbA1C: No results for input(s): "HGBA1C" in the last 72 hours. CBG: Recent Labs  Lab 10/19/23 0016  GLUCAP 163*   Lipid Profile: No results for input(s): "CHOL", "HDL", "LDLCALC", "TRIG", "CHOLHDL", "LDLDIRECT" in the last 72 hours. Thyroid Function Tests: No results for input(s): "TSH", "T4TOTAL", "FREET4", "T3FREE", "THYROIDAB" in the last 72 hours. Anemia Panel: No results for input(s):  "VITAMINB12", "FOLATE", "FERRITIN", "TIBC", "IRON", "RETICCTPCT" in the last 72 hours. Urine analysis:    Component Value Date/Time   COLORURINE STRAW (A) 10/19/2023 0003   APPEARANCEUR CLEAR (A) 10/19/2023 0003   LABSPEC 1.029 10/19/2023 0003   PHURINE 5.0 10/19/2023 0003   GLUCOSEU NEGATIVE 10/19/2023 0003   HGBUR SMALL (A) 10/19/2023 0003   BILIRUBINUR NEGATIVE 10/19/2023 0003   KETONESUR NEGATIVE 10/19/2023 0003   PROTEINUR 100 (A) 10/19/2023 0003   NITRITE NEGATIVE 10/19/2023 0003   LEUKOCYTESUR NEGATIVE 10/19/2023 0003    Radiological Exams on Admission: CT Angio Chest PE W and/or Wo Contrast Result Date: 10/19/2023 CLINICAL DATA:  Pulmonary embolism (PE) suspected, high prob. Seizures. Possible overdose on gabapentin. Confused and hypoxic. EXAM: CT ANGIOGRAPHY CHEST WITH CONTRAST TECHNIQUE: Multidetector CT imaging of the chest was performed using the standard protocol during bolus administration of intravenous contrast. Multiplanar CT image reconstructions and MIPs were obtained to evaluate the vascular anatomy. RADIATION DOSE REDUCTION: This exam was performed according to the departmental dose-optimization program which includes automated exposure control, adjustment of the mA and/or kV according to patient size and/or use of iterative reconstruction technique. CONTRAST:  OMNIPAQUE IOHEXOL 350 MG/ML SOLN COMPARISON:  Chest x-ray 10/19/2023, CT angiography chest 09/01/2015. FINDINGS: Cardiovascular: Poor opacification of the pulmonary arteries to the segmental level. No evidence of central pulmonary embolism. Limited evaluation more distally due to timing of contrast and body habitus. Normal heart size. No pericardial effusion. Mediastinum/Nodes: No enlarged mediastinal, hilar, or axillary lymph nodes. Thyroid gland, trachea, and esophagus demonstrate no significant findings. Lungs/Pleura: No focal consolidation. No pulmonary nodule. No pulmonary mass. No pleural effusion. No  pneumothorax. Upper Abdomen: Status post cholecystectomy.  No acute abnormality. Musculoskeletal: No chest wall abnormality. No suspicious lytic or blastic osseous lesions. No acute displaced fracture. Review of the MIP images confirms the above findings. IMPRESSION: 1. No central pulmonary embolism. Limited evaluation more distally due to timing of contrast and body habitus. 2.  No acute intrathoracic abnormality. Electronically Signed   By: Tish Frederickson M.D.   On: 10/19/2023 01:36   CT Head Wo Contrast Result Date: 10/19/2023 CLINICAL DATA:  Seizure EXAM: CT HEAD WITHOUT CONTRAST TECHNIQUE: Contiguous axial images were obtained from the base of the skull through the vertex without intravenous contrast. RADIATION DOSE REDUCTION: This exam was performed according to the departmental dose-optimization program which includes automated exposure control, adjustment of the mA and/or kV according to patient size and/or use of iterative reconstruction technique. COMPARISON:  04/29/2016 FINDINGS: Brain: No mass,hemorrhage or extra-axial collection. Normal appearance of the parenchyma and CSF spaces. Vascular: No hyperdense vessel or unexpected vascular calcification. Skull: The visualized skull base, calvarium and extracranial soft tissues are normal. Sinuses/Orbits: No fluid levels or advanced mucosal thickening of the visualized paranasal sinuses. No mastoid or middle ear effusion. Normal orbits. Other: None. IMPRESSION: Normal head CT. Electronically Signed   By: Deatra Robinson M.D.   On: 10/19/2023 01:32   DG Chest Port 1 View Result Date: 10/19/2023 CLINICAL DATA:  Hypoxia EXAM: PORTABLE CHEST 1 VIEW COMPARISON:  09/01/2015 FINDINGS: Heart and mediastinal contours are within normal limits. No focal opacities or effusions. No acute bony abnormality. IMPRESSION: No active cardiopulmonary disease. Electronically Signed   By: Charlett Nose M.D.   On: 10/19/2023 00:21     Data Reviewed: Relevant notes from  primary care and specialist visits, past discharge summaries as available in EHR, including Care Everywhere. Prior diagnostic testing as pertinent to current admission diagnoses Updated medications and problem lists for reconciliation ED course, including vitals, labs, imaging, treatment and response to treatment Triage notes, nursing and pharmacy notes and ED provider's notes Notable results as noted in HPI   Assessment and Plan: * Recurrent seizures (HCC) History of seizure disorder Differential includes seizure provoked by gabapentin overuse versus EtOH withdrawal Ativan as needed seizure Patient was loaded with Keppra in the ED Continue Keppra EEG Neurology consult  Adverse reaction to gabapentin, initial encounter Patient was reportedly sweaty and shaky after taking an undisclosed amount of gabapentin Had a similar seizure-like activity with gabapentin with ED visit 01/2023 Hold off on additional gabapentin Poison control was  consulted from the ED with recommendation for 6-hour follow-up from time of last seizure  Hypoxia Patient with hypoxia requiring 4 L to maintain sats in the low 90s Some concern for aspiration given seizure CTA chest with no acute abnormality Continue supplemental oxygen and wean as tolerated Aspiration precautions  Alcohol use disorder Patient drinks a 12 pack of beer daily. EtOH less than 10 CIWA withdrawal protocol Thiamine folate and multivitamin  HTN (hypertension) Normotensive  Obesity, Class III, BMI 40-49.9 (morbid obesity) (HCC) Complicating factor to overall prognosis and care     DVT prophylaxis: Lovenox  Consults: neurology  Advance Care Planning:   Code Status: Prior   Family Communication: none  Disposition Plan: Back to previous home environment  Severity of Illness: The appropriate patient status for this patient is OBSERVATION. Observation status is judged to be reasonable and necessary in order to provide the  required intensity of service to ensure the patient's safety. The patient's presenting symptoms, physical exam findings, and initial radiographic and laboratory data in the context of their medical condition is felt to place them at decreased risk for further clinical deterioration. Furthermore, it is anticipated that the patient will be medically stable for discharge from the hospital within 2 midnights of admission.   Author: Andris Baumann, MD 10/19/2023 4:25 AM  For on call review [email or web address].

## 2023-10-19 NOTE — Discharge Instructions (Addendum)
 To control seizures, your medications have been adjusted as follows:  In ADDITION to taking your gabapentin 800 mg three times a day -Increase gabapentin with an additional 400 mg nightly for 1 week,  -then add 400 mg to your midday dose as well as to your nightly dose for 1 week,  -then add 400 mg 3 times daily going forward  This will gradually achieve a total daily dose of 3600 mg/day (1200 mg total 3 times daily)  Please keep track of any other spells you have, including shaking spells, loss of consciousness events, staring spells etc to review with your neurologist  Please follow-up with [facility] clinic neurology, call [phone] to make an appointment  Spell log:  - Date and time of event: - Description of event:  - Prodome (any warning / premonition event is going to happen): - Post-spell symptoms: - Any potential triggers:   Standard seizure precautions: Per [employer] statutes, patients with seizures are not allowed to drive until  they have been seizure-free for six months. Use caution when using heavy equipment or power tools. Avoid working on ladders or at heights. Take showers instead of baths. Ensure the water temperature is not too high on the home water heater. Do not go swimming alone. When caring for infants or small children, sit down when holding, feeding, or changing them to minimize risk of injury to the child in the event you have a seizure.  To reduce risk of seizures, maintain good sleep hygiene avoid alcohol and illicit drug use, take all anti-seizure medications as prescribed.

## 2023-10-19 NOTE — Assessment & Plan Note (Signed)
 Patient with hypoxia requiring 4 L to maintain sats in the low 90s Some concern for aspiration given seizure CTA chest with no acute abnormality Continue supplemental oxygen and wean as tolerated Aspiration precautions

## 2023-10-19 NOTE — Progress Notes (Signed)
 Anticoagulation monitoring(Lovenox):  43 yo male ordered Lovenox 40 mg Q24h    Filed Weights   10/18/23 2349  Weight: (!) 146 kg (321 lb 14 oz)   BMI 40.2   Lab Results  Component Value Date   CREATININE 0.86 10/18/2023   CREATININE 0.96 12/25/2019   CREATININE 1.33 (H) 12/07/2019   Estimated Creatinine Clearance: 174.4 mL/min (by C-G formula based on SCr of 0.86 mg/dL). Hemoglobin & Hematocrit     Component Value Date/Time   HGB 15.7 10/18/2023 2347   HCT 47.2 10/18/2023 2347     Per Protocol for Patient with estCrcl > 30 ml/min and BMI > 30, will transition to Lovenox 72.5 mg Q24h.

## 2023-10-19 NOTE — Assessment & Plan Note (Signed)
 Patient was reportedly sweaty and shaky after taking an undisclosed amount of gabapentin Had a similar seizure-like activity with gabapentin with ED visit 01/2023 Hold off on additional gabapentin Poison control was consulted from the ED with recommendation for 6-hour follow-up from time of last seizure

## 2023-10-19 NOTE — Hospital Course (Signed)
 Marland Kitchen

## 2023-10-19 NOTE — Assessment & Plan Note (Signed)
 Complicating factor to overall prognosis and care

## 2023-10-19 NOTE — Assessment & Plan Note (Signed)
 Normotensive

## 2023-10-21 ENCOUNTER — Ambulatory Visit: Payer: Medicaid Other | Admitting: Nurse Practitioner

## 2023-12-23 ENCOUNTER — Ambulatory Visit: Payer: Self-pay | Admitting: Family Medicine

## 2023-12-31 ENCOUNTER — Emergency Department

## 2023-12-31 ENCOUNTER — Emergency Department
Admission: EM | Admit: 2023-12-31 | Discharge: 2024-01-01 | Disposition: A | Discharge status: Home / Self Care | Attending: Emergency Medicine | Admitting: Emergency Medicine

## 2023-12-31 DIAGNOSIS — F10239 Alcohol dependence with withdrawal, unspecified: Secondary | ICD-10-CM | POA: Diagnosis not present

## 2023-12-31 DIAGNOSIS — E119 Type 2 diabetes mellitus without complications: Secondary | ICD-10-CM | POA: Insufficient documentation

## 2023-12-31 DIAGNOSIS — I10 Essential (primary) hypertension: Secondary | ICD-10-CM | POA: Diagnosis not present

## 2023-12-31 DIAGNOSIS — F411 Generalized anxiety disorder: Secondary | ICD-10-CM | POA: Insufficient documentation

## 2023-12-31 DIAGNOSIS — F10939 Alcohol use, unspecified with withdrawal, unspecified: Secondary | ICD-10-CM

## 2023-12-31 DIAGNOSIS — J45909 Unspecified asthma, uncomplicated: Secondary | ICD-10-CM | POA: Insufficient documentation

## 2023-12-31 DIAGNOSIS — T426X4A Poisoning by other antiepileptic and sedative-hypnotic drugs, undetermined, initial encounter: Secondary | ICD-10-CM

## 2023-12-31 DIAGNOSIS — T426X1A Poisoning by other antiepileptic and sedative-hypnotic drugs, accidental (unintentional), initial encounter: Secondary | ICD-10-CM | POA: Insufficient documentation

## 2023-12-31 DIAGNOSIS — Y900 Blood alcohol level of less than 20 mg/100 ml: Secondary | ICD-10-CM | POA: Insufficient documentation

## 2023-12-31 LAB — CBC WITH DIFFERENTIAL/PLATELET
Abs Immature Granulocytes: 0.02 10*3/uL (ref 0.00–0.07)
Basophils Absolute: 0 10*3/uL (ref 0.0–0.1)
Basophils Relative: 0 %
Eosinophils Absolute: 0 10*3/uL (ref 0.0–0.5)
Eosinophils Relative: 0 %
HCT: 46.6 % (ref 39.0–52.0)
Hemoglobin: 15.6 g/dL (ref 13.0–17.0)
Immature Granulocytes: 0 %
Lymphocytes Relative: 13 %
Lymphs Abs: 1.3 10*3/uL (ref 0.7–4.0)
MCH: 29.5 pg (ref 26.0–34.0)
MCHC: 33.5 g/dL (ref 30.0–36.0)
MCV: 88.1 fL (ref 80.0–100.0)
Monocytes Absolute: 0.3 10*3/uL (ref 0.1–1.0)
Monocytes Relative: 3 %
Neutro Abs: 8 10*3/uL — ABNORMAL HIGH (ref 1.7–7.7)
Neutrophils Relative %: 84 %
Platelets: 298 10*3/uL (ref 150–400)
RBC: 5.29 MIL/uL (ref 4.22–5.81)
RDW: 13.8 % (ref 11.5–15.5)
WBC: 9.6 10*3/uL (ref 4.0–10.5)
nRBC: 0 % (ref 0.0–0.2)

## 2023-12-31 LAB — URINE DRUG SCREEN, QUALITATIVE (ARMC ONLY)
Amphetamines, Ur Screen: NOT DETECTED
Barbiturates, Ur Screen: NOT DETECTED
Benzodiazepine, Ur Scrn: POSITIVE — AB
Cannabinoid 50 Ng, Ur ~~LOC~~: POSITIVE — AB
Cocaine Metabolite,Ur ~~LOC~~: NOT DETECTED
MDMA (Ecstasy)Ur Screen: NOT DETECTED
Methadone Scn, Ur: NOT DETECTED
Opiate, Ur Screen: NOT DETECTED
Phencyclidine (PCP) Ur S: NOT DETECTED
Tricyclic, Ur Screen: NOT DETECTED

## 2023-12-31 LAB — COMPREHENSIVE METABOLIC PANEL WITH GFR
ALT: 23 U/L (ref 0–44)
AST: 20 U/L (ref 15–41)
Albumin: 4.1 g/dL (ref 3.5–5.0)
Alkaline Phosphatase: 77 U/L (ref 38–126)
Anion gap: 8 (ref 5–15)
BUN: 10 mg/dL (ref 6–20)
CO2: 30 mmol/L (ref 22–32)
Calcium: 8.7 mg/dL — ABNORMAL LOW (ref 8.9–10.3)
Chloride: 102 mmol/L (ref 98–111)
Creatinine, Ser: 0.7 mg/dL (ref 0.61–1.24)
GFR, Estimated: >60 mL/min (ref >60)
Glucose, Bld: 126 mg/dL — ABNORMAL HIGH (ref 70–99)
Potassium: 3.8 mmol/L (ref 3.5–5.1)
Sodium: 140 mmol/L (ref 135–145)
Total Bilirubin: 0.4 mg/dL (ref 0.0–1.2)
Total Protein: 7.9 g/dL (ref 6.5–8.1)

## 2023-12-31 LAB — ETHANOL: Alcohol, Ethyl (B): <15 mg/dL (ref <15)

## 2023-12-31 LAB — SALICYLATE LEVEL: Salicylate Lvl: <7 mg/dL — ABNORMAL LOW (ref 7.0–30.0)

## 2023-12-31 LAB — ACETAMINOPHEN LEVEL: Acetaminophen (Tylenol), Serum: <10 ug/mL — ABNORMAL LOW (ref 10–30)

## 2023-12-31 MED ORDER — LORAZEPAM 2 MG/ML IJ SOLN
2.0000 mg | Freq: Once | INTRAMUSCULAR | Status: AC
  Administered 2023-12-31: 2 mg via INTRAVENOUS
  Filled 2023-12-31: qty 1

## 2023-12-31 MED ORDER — CHLORDIAZEPOXIDE HCL 25 MG PO CAPS
50.0000 mg | ORAL_CAPSULE | Freq: Once | ORAL | Status: AC
  Administered 2023-12-31: 50 mg via ORAL
  Filled 2023-12-31: qty 2

## 2023-12-31 NOTE — ED Provider Notes (Addendum)
 [facility] Provider Note    Event Date/Time   First MD Initiated Contact with Patient 12/31/23 2055     (approximate)   History   No chief complaint on file.   HPI  Joseph [name] is a 43 y.o. male past medical history significant for alcohol abuse, chronic pain on gabapentin , presents to the emergency department following an overdose.  Patient states that he took an unknown amount of gabapentin  and takes more than he is supposed to.  Unable to state why he took it.  Just states that he was feeling bad.  States that his last drink of alcohol was approximately 4 days ago.  States that he drinks approximately 12 beers a day.  Does states that he had history of seizures but he does not take any seizure medication.  On chart review patient has a history of cocaine and heroin use, history of alcohol use disorder and has had seizures in the past.  Was evaluated by neurology who did not start him on an AED and instead increased his gabapentin .     Physical Exam   Triage Vital Signs: ED Triage Vitals  Encounter Vitals Group     BP 12/31/23 2058 (!) 182/100     Systolic BP Percentile --      Diastolic BP Percentile --      Pulse Rate 12/31/23 2058 85     Resp 12/31/23 2113 16     Temp 12/31/23 2058 98.3 F (36.8 C)     Temp Source 12/31/23 2058 Oral     SpO2 12/31/23 2058 90 %     Weight --      Height --      Head Circumference --      Peak Flow --      Pain Score 12/31/23 2100 0     Pain Loc --      Pain Education --      Exclude from Growth Chart --     Most recent vital signs: Vitals:   12/31/23 2143 12/31/23 2215  BP:  139/79  Pulse:  76  Resp:    Temp:    SpO2: 93%     Physical Exam Constitutional:      Appearance: He is well-developed.  HENT:     Head: Atraumatic.  Eyes:     Conjunctiva/sclera: Conjunctivae normal.  Cardiovascular:     Rate and Rhythm: Regular rhythm.  Pulmonary:     Effort: No respiratory distress.   Musculoskeletal:     Cervical back: Normal range of motion.  Skin:    General: Skin is warm.     Capillary Refill: Capillary refill takes less than 2 seconds.  Neurological:     General: No focal deficit present.     Mental Status: He is alert. Mental status is at baseline.     Comments: Tremulous, shaking, eyes rolling back in his head, not communicating and moving all extremities     IMPRESSION / MDM / ASSESSMENT AND PLAN / ED COURSE  I reviewed the triage vital signs and the nursing notes.  On arrival patient with questionable seizure-like activity versus tremors, last drink of alcohol was 4 days ago.  Patient was given 2 mg of IV Ativan .  Differential diagnosis including alcohol withdrawal seizures, gabapentin  overdose, electrolyte abnormality, intracranial hemorrhage   EKG  I, Viviano Ground, the attending physician, personally viewed and interpreted this ECG.   Rate: Normal  Rhythm: Normal sinus  Axis: Normal  Intervals: Normal  ST&T Change: None  No tachycardic or bradycardic dysrhythmias while on cardiac telemetry.  RADIOLOGY I independently reviewed imaging, my interpretation of imaging: CT scan of the head with no signs of intracranial hemorrhage.  Read as no acute findings  LABS (all labs ordered are listed, but only abnormal results are displayed) Labs interpreted as -    Labs Reviewed  COMPREHENSIVE METABOLIC PANEL WITH GFR - Abnormal; Notable for the following components:      Result Value   Glucose, Bld 126 (*)    Calcium 8.7 (*)    All other components within normal limits  URINE DRUG SCREEN, QUALITATIVE ([facility] ONLY) - Abnormal; Notable for the following components:   Cannabinoid 50 Ng, Ur [place] POSITIVE (*)    Benzodiazepine, Ur Scrn POSITIVE (*)    All other components within normal limits  CBC WITH DIFFERENTIAL/PLATELET - Abnormal; Notable for the following components:   Neutro Abs 8.0 (*)    All other components within normal limits  SALICYLATE  LEVEL - Abnormal; Notable for the following components:   Salicylate Lvl <7.0 (*)    All other components within normal limits  ACETAMINOPHEN  LEVEL - Abnormal; Notable for the following components:   Acetaminophen  (Tylenol ), Serum <10 (*)    All other components within normal limits  ETHANOL     MDM  Poison control notified, recommended 6-hour observation.  Repeat EKG.  Patient with no signs of coingestions.  Alcohol level is undetectable.  UDS is positive for cannabis and benzodiazepine however he did receive a benzodiazepine.  After IV Ativan  repeat CIWA score was 4. Given Librium 50 mg PO to prevent further alcohol withdrawal.  Given questionable suicide attempt and overdose on gabapentin  will IVC the patient for further evaluation with psychiatry.  Patient will be medically cleared after 6-hour observation and repeat EKG at 6 hours.  On reevaluation patient is now alert and oriented much more coherent.     PROCEDURES:  Critical Care performed: No  Procedures  Patient's presentation is most consistent with acute presentation with potential threat to life or bodily function.   MEDICATIONS ORDERED IN ED: Medications  LORazepam  (ATIVAN ) injection 2 mg (2 mg Intravenous Given 12/31/23 2128)  chlordiazePOXIDE (LIBRIUM) capsule 50 mg (50 mg Oral Given 12/31/23 2226)    FINAL CLINICAL IMPRESSION(S) / ED DIAGNOSES   Final diagnoses:  Gabapentin  overdose, undetermined intent, initial encounter  Alcohol withdrawal syndrome with complication (HCC)     Rx / DC Orders   ED Discharge Orders     None        Note:  This document was prepared using Dragon voice recognition software and may include unintentional dictation errors.   Viviano Ground, MD 12/31/23 0865    Viviano Ground, MD 12/31/23 2325

## 2023-12-31 NOTE — ED Notes (Signed)
 [place] Poison control was consulted for Gabapentin  overdoes. Spoke to Elbert, Charity fundraiser who reccommended pt be monitored for 6 hours. Joseph [name] has recommenced that a repeat EKG should be re-taken in 4-6 hours. If pt's QTC is longer than 500 and QRS is longer than 140 it is recommended to replace Mg and K+ as well as a sodium bicarb bolus initiated at 1-2mEq/kg IV push. Continue to monitor for SNS depression.

## 2023-12-31 NOTE — ED Triage Notes (Signed)
 Pt arrived via EMS from home for tremors. Took an unknown large amount of gabapentin . Pt has not had a drink in 4 days. Pt has not taken BP medication  today.

## 2023-12-31 NOTE — ED Notes (Addendum)
 Pt is A&Ox4. Pt states that he took about 5-6 tablets of 600mg  Gabapentin  earlier in the day. Pt states that his legs were swollen and hurting when he was at work. Pt stated he began to feel "bad" around 1430 and began having tremors and seizure like activities.

## 2023-12-31 NOTE — ED Triage Notes (Signed)
 Pt A&Ox2. Pt is visibaly sweating and visible tremors noted. Pt O2 was 85 on RA. Pt on 3L at 93%. Pt denies SI.

## 2024-01-01 MED ORDER — CHLORDIAZEPOXIDE HCL 25 MG PO CAPS: 25.0000 mg | ORAL_CAPSULE | ORAL | Status: DC

## 2024-01-01 MED ORDER — CHLORDIAZEPOXIDE HCL 25 MG PO CAPS: 25.0000 mg | ORAL_CAPSULE | Freq: Every day | ORAL | Status: DC

## 2024-01-01 MED ORDER — CHLORDIAZEPOXIDE HCL 25 MG PO CAPS: ORAL_CAPSULE | ORAL | 0 refills | Status: AC

## 2024-01-01 MED ORDER — ONDANSETRON 4 MG PO TBDP: 4.0000 mg | ORAL_TABLET | Freq: Four times a day (QID) | ORAL | Status: DC | PRN

## 2024-01-01 MED ORDER — CHLORDIAZEPOXIDE HCL 25 MG PO CAPS: 25.0000 mg | ORAL_CAPSULE | Freq: Three times a day (TID) | ORAL | Status: DC

## 2024-01-01 MED ORDER — CHLORDIAZEPOXIDE HCL 25 MG PO CAPS
25.0000 mg | ORAL_CAPSULE | Freq: Four times a day (QID) | ORAL | Status: DC
Start: 2024-01-01 — End: 2024-01-01

## 2024-01-01 MED ORDER — LOPERAMIDE HCL 2 MG PO CAPS: 2.0000 mg | ORAL_CAPSULE | ORAL | Status: DC | PRN

## 2024-01-01 MED ORDER — HYDROXYZINE HCL 25 MG PO TABS
25.0000 mg | ORAL_TABLET | Freq: Four times a day (QID) | ORAL | Status: DC | PRN
Start: 2024-01-01 — End: 2024-01-01

## 2024-01-01 MED ORDER — CHLORDIAZEPOXIDE HCL 25 MG PO CAPS
50.0000 mg | ORAL_CAPSULE | Freq: Once | ORAL | Status: AC
  Administered 2024-01-01: 50 mg via ORAL
  Filled 2024-01-01: qty 2

## 2024-01-01 MED ORDER — ADULT MULTIVITAMIN W/MINERALS CH: 1.0000 | ORAL_TABLET | Freq: Every day | ORAL | Status: DC

## 2024-01-01 MED ORDER — THIAMINE HCL 100 MG/ML IJ SOLN
100.0000 mg | Freq: Once | INTRAMUSCULAR | Status: AC
  Administered 2024-01-01: 100 mg via INTRAMUSCULAR
  Filled 2024-01-01: qty 2

## 2024-01-01 NOTE — ED Notes (Signed)
 Pt personal knife labeled and handed over to Security for keeping while pt is under IVC.

## 2024-01-01 NOTE — ED Provider Notes (Addendum)
 Patient reevaluated at 3 AM after the 6-hour observation period.  Resting comfortably with no signs of active withdrawal at this time.  Librium taper ordered per hospital order set.  Repeat EKG no significant changes, patient medically cleared under IVC for psychiatric evaluation.  -- Cleared by psychiatry for discharge, given outpatient detox resources substance abuse resources.  I reevaluated the patient who does not appear to be in alcohol withdrawal and resting comfortably at this time.  He denies any self-harm intent while taking the medications yesterday.  He is adamant that he will stop drinking and wants to go to a detox program.  I wrote him a prescription for Librium taper with very clear instructions and precautions while taking this medication including not drinking alcohol or using other benzodiazepines or sedating medications while on the Librium.  He understands the return to the emergency department should he have any new or worsening symptoms.    Buell Carmin, MD 01/01/24 [phone]

## 2024-01-01 NOTE — BH Assessment (Signed)
 Per Stan Eans NP, pt is psych cleared.  Writer provided pt with resources for substance abuse treatment and local AA meeting listings.

## 2024-01-01 NOTE — ED Notes (Signed)
 IVC PAPERS  RESCINDED  PER  DR  Margery Sheets MD  Lucendia Rusk  RN

## 2024-01-01 NOTE — ED Notes (Signed)
 Patient Belongings: -brown wallet -black Iphone -brown pocket knife -lighter -gray shirt -black pants -black shoes -white socks -black belt -gray underwear

## 2024-01-01 NOTE — Discharge Instructions (Addendum)
 Take your Librium taper as prescribed.  As we discussed, do not drink alcohol or use other benzodiazepines while using this medication.  Call your alcohol detox programs in the resource packet given to you by psychiatrist.  Thank you for choosing us  for your health care today!  Please see your primary doctor this week for a follow up appointment.   If you have any new, worsening, or unexpected symptoms call your doctor right away or come back to the emergency department for reevaluation.  It was my pleasure to care for you today.   Arron Large Margery Sheets, MD

## 2024-01-01 NOTE — ED Notes (Signed)
 Ivc/ psych consult pending

## 2024-01-01 NOTE — ED Notes (Signed)
 RN to obtain new EKG at 0230 and look for QTC and QRS numbers.

## 2024-01-01 NOTE — Consult Note (Signed)
 [facility] Psychiatric Consult Initial  Patient Name: .Joseph [name]  MRN: [id number]  DOB: 1980-09-26  Consult Order details:  Orders (From admission, onward)     Start     Ordered   12/31/23 2308  CONSULT TO CALL ACT TEAM       Ordering Provider: Viviano Ground, MD  Provider:  (Not yet assigned)  Question:  Reason for Consult?  Answer:  Psych consult   12/31/23 2308   12/31/23 2308  IP CONSULT TO PSYCHIATRY       Ordering Provider: Viviano Ground, MD  Provider:  (Not yet assigned)  Question Answer Comment  Consult Timeframe URGENT - requires response within 12 hours   URGENT timeframe requires provider to provider communication, has the provider to provider communication been completed Yes   Reason for Consult? Consult for medication management   Contact phone number where the requesting provider can be reached [phone]      12/31/23 2308             Mode of Visit: Tele-visit Virtual Statement:TELE PSYCHIATRY ATTESTATION & CONSENT As the provider for this telehealth consult, I attest that I verified the patient's identity using two separate identifiers, introduced myself to the patient, provided my credentials, disclosed my location, and performed this encounter via a HIPAA-compliant, real-time, face-to-face, two-way, interactive audio and video platform and with the full consent and agreement of the patient (or guardian as applicable.) Patient physical location: [facility]. Telehealth provider physical location: home office in state of [place] .   Video start time:   Video end time:      Psychiatry Consult Evaluation  Service Date: Jan 01, 2024 LOS:  LOS: 0 days  Chief Complaint Overdose  Primary Psychiatric Diagnoses  ETOH Abuse 2.  GAD  Assessment  Joseph [name] is a 43 y.o. male admitted: Presented to the EDfor 12/31/2023  8:55 PM for seizure-like activity likely related to recent alcohol cessation and Gabapentin  overuse. He carries the  psychiatric diagnoses of alcohol use disorder and has a past medical history of opioid use disorder.  His current presentation of seizure-like symptoms and withdrawal concerns is most consistent with alcohol withdrawal with possible Gabapentin  misuse. He meets criteria for clinical monitoring and referral to outpatient substance use services based on recent cessation of heavy alcohol use, inappropriate medication use, and daily THC consumption.  Current outpatient psychotropic medications include Gabapentin  600 mg BID and Suboxone , and historically he has had a reported stable response to these medications. He was non-compliant with Gabapentin  dosing prior to admission as evidenced by his self-reported intake of 3 tablets BID instead of 2.  On initial examination, patient is alert, cooperative, and oriented with no acute withdrawal symptoms at the time of evaluation.   Diagnoses:  Active Hospital problems: Active Problems:   * No active hospital problems. *    Plan    ## Medical Decision Making Capacity: Not specifically addressed in this encounter   ## Disposition:-- There are no psychiatric contraindications to discharge at this time  ## Behavioral / Environmental: - No specific recommendations at this time.     ## Safety and Observation Level:  - Based on my clinical evaluation, I estimate the patient to be at no risk of self harm in the current setting. - At this time, we recommend  routine. This decision is based on my review of the chart including patient's history and current presentation, interview of the patient, mental status examination, and consideration of  suicide risk including evaluating suicidal ideation, plan, intent, suicidal or self-harm behaviors, risk factors, and protective factors. This judgment is based on our ability to directly address suicide risk, implement suicide prevention strategies, and develop a safety plan while the patient is in the clinical setting.  Please contact our team if there is a concern that risk level has changed.  CSSR Risk Category:C-SSRS RISK CATEGORY: No Risk  Suicide Risk Assessment: Patient has following modifiable risk factors for suicide: medication noncompliance, which we are addressing by patient education. Patient has following non-modifiable or demographic risk factors for suicide: male gender Patient has the following protective factors against suicide: Supportive family  Thank you for this consult request. Recommendations have been communicated to the primary team.  We will recommend that patient is psychiatrically clear at this time.   Jermanie Minshall, NP       History of Present Illness  Relevant Aspects of Hospital ED Course:  Admitted on 12/31/2023 for overdose.  Patient Report:  The patient is a 43 year old male who presented voluntarily to the ED reporting seizure-like activity. He states he recently stopped drinking alcohol approximately four days ago and attributes the episode to withdrawal symptoms possibly exacerbated by increased Gabapentin  intake. The patient reports drinking 12-18 beers daily since the death of his mother three years ago. He reports taking more Gabapentin  than prescribed 3 tablets (600 mg each) BID instead of the prescribed 2 tablets BID. The patient is currently on Suboxone  for a history of opioid use disorder. He denies suicidal ideation (SI), homicidal ideation (HI), or auditory/visual hallucinations (AVH). He lives with his father and girlfriend and reports daily use of THC. The patient is open to pursuing outpatient support.  Psych ROS:  Depression: no Anxiety:  yes Mania (lifetime and current): no Psychosis: (lifetime and current): no   Review of Systems  Constitutional: Negative.   HENT: Negative.    Eyes: Negative.   Respiratory: Negative.    Cardiovascular: Negative.   Gastrointestinal: Negative.   Genitourinary: Negative.   Musculoskeletal: Negative.    Neurological: Negative.   Psychiatric/Behavioral: Negative.      Psychiatric and Social History  Psychiatric History:  Information collected from patient and chart history  Prev Dx/Sx: GAD, ADHD Current Psych Provider: yes Home Meds (current): gabapentin , suboxone  Previous Med Trials: unknown Therapy: denies  Prior Psych Hospitalization: unknown  Prior Self Harm: no Prior Violence: no  Family Psych History: no pertinent family psych history Family Hx suicide: unknown  Social History:  Developmental Hx: unknown Educational Hx: unknown Occupational Hx: Mgr at [employer] Hx: denies Living Situation: wirh dad and girl friend Spiritual Hx: unknown Access to weapons/lethal means: no   Substance History Alcohol: yes  Type of alcohol beer Last Drink 4 days ago Number of drinks per day 12-18 History of alcohol withdrawal seizures yes History of DT's no Tobacco: denies Illicit drugs: THC Prescription drug abuse: denies Rehab hx: yes   Exam Findings   Vital Signs:  Temp:  [98.3 F (36.8 C)] 98.3 F (36.8 C) (05/27 2058) Pulse Rate:  [76-85] 76 (05/27 2215) Resp:  [16] 16 (05/27 2113) BP: (139-182)/(79-104) 139/79 (05/27 2215) SpO2:  [90 %-97 %] 94 % (05/27 2327) Blood pressure 139/79, pulse 76, temperature 98.3 F (36.8 C), temperature source Oral, resp. rate 16, SpO2 94%. There is no height or weight on file to calculate BMI.  Physical Exam HENT:     Head: Normocephalic.     Nose: Nose normal.  Mouth/Throat:     Pharynx: Oropharynx is clear.  Eyes:     Extraocular Movements: Extraocular movements intact.  Pulmonary:     Effort: Pulmonary effort is normal.  Musculoskeletal:        General: Normal range of motion.  Skin:    General: Skin is dry.  Neurological:     General: No focal deficit present.     Mental Status: He is alert.     Other History   These have been pulled in through the EMR, reviewed, and updated if appropriate.  Family  History:  The patient's family history includes Hypertension in an other family member.  Medical History: Past Medical History:  Diagnosis Date   Asthma    Bipolar 1 disorder (HCC)    Diabetes mellitus without complication (HCC)    Drug abuse (HCC)    Hypertension     Surgical History: Past Surgical History:  Procedure Laterality Date   APPENDECTOMY     BACK SURGERY     CHOLECYSTECTOMY       Medications:  No current facility-administered medications for this encounter.  Current Outpatient Medications:    ALPRAZolam (XANAX) 0.5 MG tablet, Take 0.5 mg by mouth at bedtime., Disp: , Rfl:    amphetamine -dextroamphetamine  (ADDERALL) 10 MG tablet, Take 10 mg by mouth 2 (two) times daily as needed., Disp: , Rfl:    buprenorphine -naloxone  (SUBOXONE ) 8-2 mg SUBL SL tablet, Place 1 tablet under the tongue 3 (three) times daily., Disp: , Rfl:    DULoxetine (CYMBALTA) 60 MG capsule, Take 60 mg by mouth daily., Disp: , Rfl:    folic acid  (FOLVITE ) 1 MG tablet, Take 1 tablet (1 mg total) by mouth daily., Disp: 30 tablet, Rfl: 1   gabapentin  (NEURONTIN ) 400 MG capsule, Take 400mg  at night for 1 week and then add 400mg  to your afternoon dosing for 1 week and then add 400mg  to your morning dosing.  Making a total of 1200mg  3 times daily going forward., Disp: 90 capsule, Rfl: 0   gabapentin  (NEURONTIN ) 800 MG tablet, Take 800 mg by mouth 3 (three) times daily., Disp: , Rfl:    naloxone  (NARCAN ) nasal spray 4 mg/0.1 mL, Use in the event of suspected overdose of heroin or opiates (Patient taking differently: Place 0.4 mg into the nose once. Use in the event of suspected overdose of heroin or opiates), Disp: 1 kit, Rfl: o   thiamine  (VITAMIN B-1) 100 MG tablet, Take 1 tablet (100 mg total) by mouth daily., Disp: 30 tablet, Rfl: 0  Allergies: No Known Allergies  Karem Tomaso, NP

## 2024-01-30 ENCOUNTER — Ambulatory Visit: Admitting: Nurse Practitioner

## 2024-01-30 ENCOUNTER — Encounter: Payer: Self-pay | Admitting: Nurse Practitioner

## 2024-01-30 VITALS — BP 150/80 | HR 73 | Temp 98.4°F | Ht 75.0 in | Wt >= 6400 oz

## 2024-01-30 DIAGNOSIS — Z6841 Body Mass Index (BMI) 40.0 and over, adult: Secondary | ICD-10-CM | POA: Diagnosis not present

## 2024-01-30 DIAGNOSIS — I10 Essential (primary) hypertension: Secondary | ICD-10-CM | POA: Diagnosis not present

## 2024-01-30 DIAGNOSIS — G8929 Other chronic pain: Secondary | ICD-10-CM | POA: Diagnosis not present

## 2024-01-30 DIAGNOSIS — Z13220 Encounter for screening for lipoid disorders: Secondary | ICD-10-CM | POA: Diagnosis not present

## 2024-01-30 DIAGNOSIS — E039 Hypothyroidism, unspecified: Secondary | ICD-10-CM | POA: Diagnosis not present

## 2024-01-30 DIAGNOSIS — J449 Chronic obstructive pulmonary disease, unspecified: Secondary | ICD-10-CM

## 2024-01-30 DIAGNOSIS — R0683 Snoring: Secondary | ICD-10-CM | POA: Diagnosis not present

## 2024-01-30 DIAGNOSIS — F1090 Alcohol use, unspecified, uncomplicated: Secondary | ICD-10-CM

## 2024-01-30 LAB — TSH: TSH: 5.71 u[IU]/mL — ABNORMAL HIGH (ref 0.35–5.50)

## 2024-01-30 LAB — CBC WITH DIFFERENTIAL/PLATELET
Basophils Absolute: 0 10*3/uL (ref 0.0–0.1)
Basophils Relative: 0.6 % (ref 0.0–3.0)
Eosinophils Absolute: 0.2 10*3/uL (ref 0.0–0.7)
Eosinophils Relative: 3.5 % (ref 0.0–5.0)
HCT: 44.1 % (ref 39.0–52.0)
Hemoglobin: 14.9 g/dL (ref 13.0–17.0)
Lymphocytes Relative: 31.4 % (ref 12.0–46.0)
Lymphs Abs: 2 10*3/uL (ref 0.7–4.0)
MCHC: 33.6 g/dL (ref 30.0–36.0)
MCV: 87.1 fl (ref 78.0–100.0)
Monocytes Absolute: 0.5 10*3/uL (ref 0.1–1.0)
Monocytes Relative: 8.2 % (ref 3.0–12.0)
Neutro Abs: 3.6 10*3/uL (ref 1.4–7.7)
Neutrophils Relative %: 56.3 % (ref 43.0–77.0)
Platelets: 252 10*3/uL (ref 150.0–400.0)
RBC: 5.07 Mil/uL (ref 4.22–5.81)
RDW: 14.1 % (ref 11.5–15.5)
WBC: 6.3 10*3/uL (ref 4.0–10.5)

## 2024-01-30 LAB — COMPREHENSIVE METABOLIC PANEL WITH GFR
ALT: 32 U/L (ref 0–53)
AST: 25 U/L (ref 0–37)
Albumin: 4.2 g/dL (ref 3.5–5.2)
Alkaline Phosphatase: 102 U/L (ref 39–117)
BUN: 14 mg/dL (ref 6–23)
CO2: 36 meq/L — ABNORMAL HIGH (ref 19–32)
Calcium: 9 mg/dL (ref 8.4–10.5)
Chloride: 97 meq/L (ref 96–112)
Creatinine, Ser: 0.74 mg/dL (ref 0.40–1.50)
GFR: 112.32 mL/min (ref >60.00)
Glucose, Bld: 124 mg/dL — ABNORMAL HIGH (ref 70–99)
Potassium: 4.3 meq/L (ref 3.5–5.1)
Sodium: 139 meq/L (ref 135–145)
Total Bilirubin: 0.3 mg/dL (ref 0.2–1.2)
Total Protein: 6.8 g/dL (ref 6.0–8.3)

## 2024-01-30 LAB — HEMOGLOBIN A1C: Hgb A1c MFr Bld: 6.6 % — ABNORMAL HIGH (ref 4.6–6.5)

## 2024-01-30 LAB — LIPID PANEL
Cholesterol: 134 mg/dL (ref 0–200)
HDL: 35.2 mg/dL — ABNORMAL LOW (ref >39.00)
LDL Cholesterol: 73 mg/dL (ref 0–99)
NonHDL: 98.77
Total CHOL/HDL Ratio: 4
Triglycerides: 130 mg/dL (ref 0.0–149.0)
VLDL: 26 mg/dL (ref 0.0–40.0)

## 2024-01-30 MED ORDER — LISINOPRIL-HYDROCHLOROTHIAZIDE 10-12.5 MG PO TABS: 1.0000 | ORAL_TABLET | Freq: Every day | ORAL | 0 refills | Status: DC

## 2024-01-30 MED ORDER — ALBUTEROL SULFATE HFA 108 (90 BASE) MCG/ACT IN AERS: 2.0000 | INHALATION_SPRAY | Freq: Four times a day (QID) | RESPIRATORY_TRACT | 0 refills | Status: AC | PRN

## 2024-01-30 MED ORDER — BUDESONIDE-FORMOTEROL FUMARATE 160-4.5 MCG/ACT IN AERO: 2.0000 | INHALATION_SPRAY | Freq: Two times a day (BID) | RESPIRATORY_TRACT | 5 refills | Status: AC

## 2024-01-30 NOTE — Progress Notes (Signed)
 Leron Glance, NP-C Phone: [phone]  Joseph [name] is a 43 y.o. male who presents today to establish care.   Discussed the use of AI scribe software for clinical note transcription with the patient, who gave verbal consent to proceed.  History of Present Illness   Joseph [name] is a 43 year old male with hypertension and hypothyroidism who presents for management of weight and blood pressure.  He is concerned about his weight and blood pressure management. He has a history of hypothyroidism and is currently without medication. He experiences feeling 'hot all the time' and sweating, but has no palpitations or hair loss. He reports fatigue, which he attributes to possible sleep apnea, as he wakes up feeling exhausted. He snores and has been witnessed to have apneic events, though he has not had a sleep study.  He has a history of hypertension and has been taking blood pressure medication inconsistently. He has not seen a primary care provider in a couple of years and has been managing his medications sporadically. He does not regularly check his blood pressure and has experienced swelling in his legs, which he manages with compression socks. He has no chest pain or dizziness, but reports shortness of breath with activity, which he attributes to his weight.  He has a history of alcohol use disorder, having consumed large quantities of alcohol daily for three years following his mother's passing. He quit drinking a month and a half ago, which he believes led to a seizure. He is on gabapentin  800 mg three times a day for neuropathy and pain. He also takes Xanax and Adderall, prescribed by [facility], and Suboxone  for substance use disorder.  He smokes a pack of cigarettes a day and experiences wheezing, for which he has used albuterol  in the past. He reports back pain described as 'tension' between his shoulder blades, which he attributes to work or weight. He also reports foot  pain, particularly around the big toe area, which he associates with a past injury.  His diet and exercise are described as 'nonexistent,' though he is trying to eat better. He is on Ozempic and has lost some weight. He typically eats one large meal a day, often at night, and acknowledges a high intake of carbs and sugars.      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/2022   Laceration of left wrist with tendon involvement 12/08/2019   Arthritis of left knee 09/18/2022   Recurrent seizures (HCC) 10/19/2023   Alcohol use disorder 10/19/2023   Adverse reaction to gabapentin , initial encounter 10/19/2023   Hypoxia 10/19/2023   Morbid obesity with body mass index (BMI) of 50.0 to 59.9 in adult (HCC) 01/30/2024   Chronic obstructive pulmonary disease (HCC) 01/30/2024   Snoring 01/30/2024   Type 2 diabetes mellitus without complication, without long-term current use of insulin (HCC) 01/31/2024   Other chronic pain 02/13/2024   Resolved Ambulatory Problems    Diagnosis Date Noted   Thyroid  disorder 10/10/2019   GAD (generalized anxiety disorder) 05/23/2022   Obesity, Class III, BMI  40-49.9 (morbid obesity) 05/23/2022   Past Medical History:  Diagnosis Date   Asthma    Bipolar 1 disorder (HCC)    Diabetes mellitus without complication (HCC)    Drug abuse (HCC)    Hypertension     Family History  Problem Relation Age of Onset   Hypertension Other     Social History   Socioeconomic History   Marital status: Single    Spouse name: Not on  file   Number of children: Not on file   Years of education: Not on file   Highest education level: Not on file  Occupational History   Not on file  Tobacco Use   Smoking status: Every Day    Current packs/day: 0.50    Average packs/day: 0.5 packs/day for 15.0 years (7.5 ttl pk-yrs)    Types: Cigarettes   Smokeless tobacco: Current  Vaping Use   Vaping status: Some Days  Substance and Sexual Activity   Alcohol use: No   Drug use: Yes    Types: Cocaine, Marijuana, IV    Comment: Percocets, sniffs heroin   Sexual activity: Not on file  Other Topics Concern   Not on file  Social History Narrative   Not on file   Social Drivers of Health   Financial Resource Strain: Not on file  Food Insecurity: Not on file  Transportation Needs: Not on file  Physical Activity: Not on file  Stress: Not on file  Social Connections: Not on file  Intimate Partner Violence: Not on file    ROS  General:  Negative for unexplained weight loss, fever Skin: Negative for new or changing mole, sore that won't heal HEENT: Negative for trouble hearing, trouble seeing, ringing in ears, mouth sores, hoarseness, change in voice, dysphagia. CV:  Negative for chest pain, dyspnea, edema, palpitations Resp: Negative for cough, hemoptysis GI: Negative for nausea, vomiting, diarrhea, constipation, abdominal pain, melena, hematochezia. GU: Negative for dysuria, incontinence, urinary hesitance, hematuria, vaginal or penile discharge, polyuria, sexual difficulty, lumps in testicle or breasts MSK: Negative for muscle cramps or aches, or swelling Neuro: Negative for headaches, weakness, numbness, dizziness, passing out/fainting Psych: Negative for depression, anxiety, memory problems  Objective  Physical Exam Vitals:   01/30/24 0819 01/30/24 1008  BP: (!) 190/90 (!) 150/80  Pulse: 73   Temp: 98.4 F (36.9 C)   SpO2: 94%     BP Readings from Last 3 Encounters:  01/30/24 (!) 150/80  01/01/24 (!) 144/91   10/19/23 (!) 141/83   Wt Readings from Last 3 Encounters:  01/30/24 (!) 442 lb 3.2 oz (200.6 kg)  10/18/23 (!) 321 lb 14 oz (146 kg)  08/18/23 (!) 322 lb 1.5 oz (146.1 kg)    Physical Exam Constitutional:      General: He is not in acute distress.    Appearance: Normal appearance. He is obese.  HENT:     Head: Normocephalic.  Cardiovascular:     Rate and Rhythm: Normal rate and regular rhythm.     Heart sounds: Normal heart sounds.  Pulmonary:     Effort: Pulmonary effort is normal.     Breath sounds: Normal breath sounds.  Skin:    General: Skin is warm and dry.  Neurological:     General: No focal deficit present.     Mental Status: He is alert.  Psychiatric:        Mood and Affect: Mood normal.        Behavior: Behavior normal.  Assessment/Plan:   Chronic obstructive pulmonary disease, unspecified COPD type (HCC) Assessment & Plan: He is a smoker with wheezing and shortness of breath, suggesting COPD. Plan to initiate inhaler treatment to manage symptoms and improve lung function. Discussed albuterol  as a rescue inhaler and the importance of a daily maintenance inhaler. Start Symbicort  twice daily and prescribe albuterol  as a rescue inhaler for shortness of breath and wheezing.  Orders: -     Albuterol  Sulfate HFA; Inhale 2 puffs into the lungs every 6 (six) hours as needed for wheezing or shortness of breath.  Dispense: 8 g; Refill: 0 -     Budesonide -Formoterol  Fumarate; Inhale 2 puffs into the lungs 2 (two) times daily.  Dispense: 1 each; Refill: 5  Morbid obesity with body mass index (BMI) of 50.0 to 59.9 in adult [facility]) Assessment & Plan: He weighs 442 pounds with increased weight gain over three years and is interested in weight loss, considering medications. We discussed the benefits of weight loss for health, pain reduction, and cardiac health, emphasizing lifestyle changes such as diet and exercise alongside medication. Weight loss injections were  discussed, noting they suppress appetite but require lifestyle modifications. Check A1c to assess for diabetes and determine insurance coverage for weight loss medication. Consider starting Mounjaro  if A1c is elevated or Zepbound  if A1c is normal, pending insurance approval. Counseled on the risk of pancreatitis and gallbladder disease. Discussed the risk of nausea. Advised to discontinue the medication and contact us  immediately if they develop abdominal pain. If they develop excessive nausea they will contact us  right away. I discussed that medullary thyroid  cancer has been seen in rats studies. The patient confirmed no personal or family history of thyroid  cancer, parathyroid cancer, or adrenal gland cancer. Discussed that we thus far have not seen medullary thyroid  cancer result from use of this type of medication in humans. Advised to monitor the thyroid  area and contact us  for any lumps, swelling, trouble swallowing, or any other changes in this area. Discussed goal weight loss of 1 to 2 pounds a week while on this medication. Emphasize a high protein diet and increased water intake. Encourage regular exercise and portion control.  Orders: -     Hemoglobin A1c  Primary hypertension Assessment & Plan: He has hypertension with inconsistent medication adherence, and his current blood pressure is elevated with leg swelling. We plan to adjust his medication regimen considering weight loss and sleep apnea impact. Start lisinopril  - hydrochlorothiazide  once daily and monitor blood pressure regularly. Schedule a follow-up blood pressure check with a nurse in two weeks and repeat lab tests to monitor kidney function.  Orders: -     Lisinopril -hydroCHLOROthiazide ; Take 1 tablet by mouth daily.  Dispense: 90 tablet; Refill: 0 -     CBC with Differential/Platelet -     Comprehensive metabolic panel with GFR  Acquired hypothyroidism Assessment & Plan: He has a history of hypothyroidism and has been without  medication. Check TSH today to ensure proper management.   Orders: -     TSH  Snoring Assessment & Plan: He reports symptoms consistent with sleep apnea, including snoring, apneic events, and unrefreshing sleep, which may contribute to hypertension and fatigue. Discussed potential improvement with weight loss. Refer to pulmonology for sleep study.  Orders: -     Pulmonary Visit  Other chronic pain Assessment & Plan: He experiences chronic pain and neuropathy, currently managed with gabapentin . There is concern about the high gabapentin  dose given his history of alcohol use. Plan  to monitor pain management and consider reducing gabapentin  if weight loss improves symptoms. Continue gabapentin  800 mg three times a day for now. Consider referral to pain management or physical medicine.    Alcohol use disorder Assessment & Plan: He has been abstinent from alcohol for a month and a half. Acknowledged progress and discussed potential health impacts of past alcohol use, including liver function and seizure risk. Emphasized continued abstinence for health improvement.    Lipid screening -     Lipid panel    Return in about 2 weeks (around 02/13/2024) for Blood pressure check with nursing then 6 week follow up.   Leron Glance, NP-C [facility]

## 2024-01-31 ENCOUNTER — Telehealth: Payer: Self-pay

## 2024-01-31 ENCOUNTER — Other Ambulatory Visit (HOSPITAL_COMMUNITY): Payer: Self-pay

## 2024-01-31 ENCOUNTER — Ambulatory Visit: Payer: Self-pay | Admitting: Nurse Practitioner

## 2024-01-31 DIAGNOSIS — E119 Type 2 diabetes mellitus without complications: Secondary | ICD-10-CM | POA: Insufficient documentation

## 2024-01-31 DIAGNOSIS — E039 Hypothyroidism, unspecified: Secondary | ICD-10-CM

## 2024-01-31 MED ORDER — TIRZEPATIDE 2.5 MG/0.5ML ~~LOC~~ SOAJ: 2.5000 mg | SUBCUTANEOUS | 0 refills | Status: DC

## 2024-01-31 MED ORDER — LEVOTHYROXINE SODIUM 50 MCG PO TABS: 50.0000 ug | ORAL_TABLET | Freq: Every day | ORAL | 0 refills | Status: AC

## 2024-01-31 MED ORDER — TIRZEPATIDE 5 MG/0.5ML ~~LOC~~ SOAJ: 5.0000 mg | SUBCUTANEOUS | 1 refills | Status: DC

## 2024-01-31 NOTE — Telephone Encounter (Signed)
 Pharmacy Patient Advocate Encounter   Received notification from CoverMyMeds that prior authorization for  Mounjaro 2.5MG /0.5ML auto-injectors is required/requested.   Insurance verification completed.   The patient is insured through [employer] .   Per test claim: PA required; PA started via CoverMyMeds. KEY [id number] . Waiting for clinical questions to populate.

## 2024-01-31 NOTE — Telephone Encounter (Signed)
 PA needed for mounjaro pt has a dx of type 2 diabetes and recent lab work for A1c in chart

## 2024-01-31 NOTE — Telephone Encounter (Signed)
 PA request has been Started. New Encounter has been or will be created for follow up. For additional info see Pharmacy Prior Auth telephone encounter from 01/31/2024.

## 2024-02-02 ENCOUNTER — Encounter: Payer: Self-pay | Admitting: Nurse Practitioner

## 2024-02-03 ENCOUNTER — Telehealth: Payer: Self-pay | Admitting: Nurse Practitioner

## 2024-02-03 ENCOUNTER — Other Ambulatory Visit (HOSPITAL_COMMUNITY): Payer: Self-pay

## 2024-02-03 NOTE — Telephone Encounter (Signed)
 Patient need labs ordered please

## 2024-02-10 ENCOUNTER — Telehealth: Payer: Self-pay

## 2024-02-10 NOTE — Telephone Encounter (Signed)
 Questions expired. New prior auth submitted.

## 2024-02-10 NOTE — Telephone Encounter (Signed)
 Pharmacy Patient Advocate Encounter   Received notification from CoverMyMeds that prior authorization for Mounjaro  2.5MG /0.5ML auto-injectors is required/requested.   Insurance verification completed.   The patient is insured through [facility] .   Per test claim: PA required; PA submitted to above mentioned insurance via CoverMyMeds Key/confirmation #/EOC [id number] Status is pending

## 2024-02-13 ENCOUNTER — Encounter: Payer: Self-pay | Admitting: Nurse Practitioner

## 2024-02-13 DIAGNOSIS — G8929 Other chronic pain: Secondary | ICD-10-CM | POA: Insufficient documentation

## 2024-02-13 NOTE — Assessment & Plan Note (Addendum)
 He experiences chronic pain and neuropathy, currently managed with gabapentin . There is concern about the high gabapentin  dose given his history of alcohol use. Plan to monitor pain management and consider reducing gabapentin  if weight loss improves symptoms. Continue gabapentin  800 mg three times a day for now. Consider referral to pain management or physical medicine.

## 2024-02-13 NOTE — Assessment & Plan Note (Signed)
 He reports symptoms consistent with sleep apnea, including snoring, apneic events, and unrefreshing sleep, which may contribute to hypertension and fatigue. Discussed potential improvement with weight loss. Refer to pulmonology for sleep study.

## 2024-02-13 NOTE — Assessment & Plan Note (Addendum)
 He weighs 442 pounds with increased weight gain over three years and is interested in weight loss, considering medications. We discussed the benefits of weight loss for health, pain reduction, and cardiac health, emphasizing lifestyle changes such as diet and exercise alongside medication. Weight loss injections were discussed, noting they suppress appetite but require lifestyle modifications. Check A1c to assess for diabetes and determine insurance coverage for weight loss medication. Consider starting Mounjaro  if A1c is elevated or Zepbound  if A1c is normal, pending insurance approval. Counseled on the risk of pancreatitis and gallbladder disease. Discussed the risk of nausea. Advised to discontinue the medication and contact us  immediately if they develop abdominal pain. If they develop excessive nausea they will contact us  right away. I discussed that medullary thyroid  cancer has been seen in rats studies. The patient confirmed no personal or family history of thyroid  cancer, parathyroid cancer, or adrenal gland cancer. Discussed that we thus far have not seen medullary thyroid  cancer result from use of this type of medication in humans. Advised to monitor the thyroid  area and contact us  for any lumps, swelling, trouble swallowing, or any other changes in this area. Discussed goal weight loss of 1 to 2 pounds a week while on this medication. Emphasize a high protein diet and increased water intake. Encourage regular exercise and portion control.

## 2024-02-13 NOTE — Assessment & Plan Note (Signed)
 He has a history of hypothyroidism and has been without medication. Check TSH today to ensure proper management.

## 2024-02-13 NOTE — Telephone Encounter (Signed)
 Pharmacy Patient Advocate Encounter  Received notification from [facility]  that Prior Authorization for  Mounjaro  2.5MG /0.5ML auto-injectors has been DENIED.  See denial reason below. No denial letter attached in CMM. Will attach denial letter to Media tab once received.   PA #/Case ID/Reference #: [id number]

## 2024-02-13 NOTE — Telephone Encounter (Signed)
 [name], Joseph L, [facility]    02/10/24 12:55 PM Note Questions expired. New prior auth submitted.      [name], Joseph L, [employer]    01/31/24  4:32 PM Note Pharmacy Patient Advocate Encounter   Received notification from CoverMyMeds that prior authorization for  Mounjaro  2.5MG /0.5ML auto-injectors is required/requested.   Insurance verification completed.   The patient is insured through [employer] .   Per test claim: PA required; PA started via CoverMyMeds. KEY [id number] . Waiting for clinical questions to populate

## 2024-02-13 NOTE — Assessment & Plan Note (Signed)
 He is a smoker with wheezing and shortness of breath, suggesting COPD. Plan to initiate inhaler treatment to manage symptoms and improve lung function. Discussed albuterol  as a rescue inhaler and the importance of a daily maintenance inhaler. Start Symbicort  twice daily and prescribe albuterol  as a rescue inhaler for shortness of breath and wheezing.

## 2024-02-13 NOTE — Assessment & Plan Note (Signed)
 He has been abstinent from alcohol for a month and a half. Acknowledged progress and discussed potential health impacts of past alcohol use, including liver function and seizure risk. Emphasized continued abstinence for health improvement.

## 2024-02-13 NOTE — Telephone Encounter (Signed)
 Tried to call pt and unable to leave a vm

## 2024-02-13 NOTE — Telephone Encounter (Signed)
 Copied from CRM [id number]. Topic: Clinical - Prescription Issue >> Feb 13, 2024 11:51 AM Carlyon D wrote: Reason for CRM: pt called says his Mounjaro  needs a prior auth and when he called Dr was out of town and he would like to start medication as soon as possible. He would like the prior auth started

## 2024-02-13 NOTE — Telephone Encounter (Signed)
 Lab appt cancelled pt placed on correct schedule for nurse visit BP check

## 2024-02-13 NOTE — Assessment & Plan Note (Signed)
 He has hypertension with inconsistent medication adherence, and his current blood pressure is elevated with leg swelling. We plan to adjust his medication regimen considering weight loss and sleep apnea impact. Start lisinopril  - hydrochlorothiazide  once daily and monitor blood pressure regularly. Schedule a follow-up blood pressure check with a nurse in two weeks and repeat lab tests to monitor kidney function.

## 2024-02-14 ENCOUNTER — Other Ambulatory Visit: Payer: Self-pay | Admitting: Nurse Practitioner

## 2024-02-14 ENCOUNTER — Ambulatory Visit

## 2024-02-14 ENCOUNTER — Other Ambulatory Visit

## 2024-02-14 DIAGNOSIS — E119 Type 2 diabetes mellitus without complications: Secondary | ICD-10-CM

## 2024-02-14 MED ORDER — METFORMIN HCL ER 500 MG PO TB24: 500.0000 mg | ORAL_TABLET | Freq: Every day | ORAL | 2 refills | Status: AC

## 2024-02-14 NOTE — Telephone Encounter (Signed)
 Pt informed and has picked up the metformin 

## 2024-02-14 NOTE — Telephone Encounter (Signed)
 Detailed vm informing pt of message below  E2C2 PLEASE RELAY MESSAGE

## 2024-02-17 ENCOUNTER — Ambulatory Visit

## 2024-02-17 ENCOUNTER — Other Ambulatory Visit

## 2024-02-19 ENCOUNTER — Ambulatory Visit

## 2024-03-02 ENCOUNTER — Ambulatory Visit: Admitting: Nurse Practitioner

## 2024-03-03 ENCOUNTER — Encounter: Payer: Self-pay | Admitting: Nurse Practitioner

## 2024-03-12 ENCOUNTER — Ambulatory Visit: Admitting: Nurse Practitioner

## 2024-03-12 NOTE — Progress Notes (Deleted)
  Leron Glance, NP-C Phone: [phone]  Joseph [name] is a 43 y.o. male who presents today for ***  ***  Social History   Tobacco Use  Smoking Status Every Day   Current packs/day: 0.50   Average packs/day: 0.5 packs/day for 15.0 years (7.5 ttl pk-yrs)   Types: Cigarettes  Smokeless Tobacco Current    Current Outpatient Medications on File Prior to Visit  Medication Sig Dispense Refill   albuterol  (VENTOLIN  HFA) 108 (90 Base) MCG/ACT inhaler Inhale 2 puffs into the lungs every 6 (six) hours as needed for wheezing or shortness of breath. 8 g 0   ALPRAZolam (XANAX) 0.5 MG tablet Take 0.5 mg by mouth at bedtime.     amphetamine -dextroamphetamine  (ADDERALL) 10 MG tablet Take 10 mg by mouth 2 (two) times daily as needed.     budesonide -formoterol  (SYMBICORT ) 160-4.5 MCG/ACT inhaler Inhale 2 puffs into the lungs 2 (two) times daily. 1 each 5   buprenorphine -naloxone  (SUBOXONE ) 8-2 mg SUBL SL tablet Place 1 tablet under the tongue 3 (three) times daily.     DULoxetine (CYMBALTA) 60 MG capsule Take 60 mg by mouth daily.     gabapentin  (NEURONTIN ) 800 MG tablet Take 800 mg by mouth 3 (three) times daily.     levothyroxine  (SYNTHROID ) 50 MCG tablet Take 1 tablet (50 mcg total) by mouth daily before breakfast. 90 tablet 0   lisinopril -hydrochlorothiazide  (ZESTORETIC ) 10-12.5 MG tablet Take 1 tablet by mouth daily. 90 tablet 0   metFORMIN  (GLUCOPHAGE -XR) 500 MG 24 hr tablet Take 1 tablet (500 mg total) by mouth daily with breakfast. 30 tablet 2   naloxone  (NARCAN ) nasal spray 4 mg/0.1 mL Use in the event of suspected overdose of heroin or opiates (Patient taking differently: Place 0.4 mg into the nose once. Use in the event of suspected overdose of heroin or opiates) 1 kit o   thiamine  (VITAMIN B-1) 100 MG tablet Take 1 tablet (100 mg total) by mouth daily. 30 tablet 0   tirzepatide  (MOUNJARO ) 2.5 MG/0.5ML Pen Inject 2.5 mg into the skin once a week. X 4 weeks then increase to 5 2 mL 0    tirzepatide  (MOUNJARO ) 5 MG/0.5ML Pen Inject 5 mg into the skin once a week. 2 mL 1   No current facility-administered medications on file prior to visit.     ROS see history of present illness  Objective  Physical Exam There were no vitals filed for this visit.  BP Readings from Last 3 Encounters:  01/30/24 (!) 150/80  01/01/24 (!) 144/91  10/19/23 (!) 141/83   Wt Readings from Last 3 Encounters:  01/30/24 (!) 442 lb 3.2 oz (200.6 kg)  10/18/23 (!) 321 lb 14 oz (146 kg)  08/18/23 (!) 322 lb 1.5 oz (146.1 kg)    Physical Exam   Assessment/Plan: Please see individual problem list.  There are no diagnoses linked to this encounter.   Health Maintenance: ***  No follow-ups on file.   Leron Glance, NP-C [facility]

## 2024-04-16 ENCOUNTER — Other Ambulatory Visit (HOSPITAL_COMMUNITY): Payer: Self-pay

## 2024-04-16 ENCOUNTER — Telehealth: Payer: Self-pay

## 2024-04-16 NOTE — Telephone Encounter (Signed)
 Pharmacy Patient Advocate Encounter   Received notification from [employer] that prior authorization for Mounjaro  2.5MG /0.5ML auto-injectors  is required/requested.   Insurance verification completed.   The patient is insured through [employer] .   Per test claim:  BYETTA, TRULICITY, VICTOZA, OZEMPIC is preferred by the insurance.  If suggested medication is appropriate, Please send in a new RX and discontinue this one. If not, please advise as to why it's not appropriate so that we may request a Prior Authorization. Please note, some preferred medications may still require a PA.  If the suggested medications have not been trialed and there are no contraindications to their use, the PA will not be submitted, as it will not be approved.

## 2024-04-23 ENCOUNTER — Other Ambulatory Visit (HOSPITAL_COMMUNITY): Payer: Self-pay

## 2024-04-23 ENCOUNTER — Other Ambulatory Visit: Payer: Self-pay | Admitting: Nurse Practitioner

## 2024-04-23 ENCOUNTER — Telehealth: Payer: Self-pay

## 2024-04-23 DIAGNOSIS — E119 Type 2 diabetes mellitus without complications: Secondary | ICD-10-CM

## 2024-04-23 MED ORDER — OZEMPIC (0.25 OR 0.5 MG/DOSE) 2 MG/3ML ~~LOC~~ SOPN: 0.2500 mg | PEN_INJECTOR | SUBCUTANEOUS | 1 refills | Status: DC

## 2024-04-23 NOTE — Telephone Encounter (Signed)
 Pharmacy Patient Advocate Encounter   Received notification from Physician's Office that prior authorization for Ozempic  (0.25 or 0.5 MG/DOSE) 2MG /3ML pen-injectors  is required/requested.   Insurance verification completed.   The patient is insured through [employer] .   Per test claim: PA required; PA submitted to above mentioned insurance via Latent Key/confirmation #/EOC [id number] Status is pending

## 2024-04-23 NOTE — Telephone Encounter (Signed)
 PA request has been Started. New Encounter has been or will be created for follow up. For additional info see Pharmacy Prior Auth telephone encounter from 04/23/2024.

## 2024-04-27 ENCOUNTER — Other Ambulatory Visit (HOSPITAL_COMMUNITY): Payer: Self-pay

## 2024-04-27 NOTE — Telephone Encounter (Signed)
 Pharmacy Patient Advocate Encounter  Received notification from [facility] that Prior Authorization for   Ozempic  (0.25 or 0.5 MG/DOSE) 2MG /3ML pen-injectors has been APPROVED from 04/23/2024 to 04/23/2025. Ran test claim, Copay is $4. This test claim was processed through [facility]- copay amounts may vary at other pharmacies due to pharmacy/plan contracts, or as the patient moves through the different stages of their insurance plan.   PA #/Case ID/Reference #: [id number]

## 2024-05-11 ENCOUNTER — Ambulatory Visit: Payer: MEDICAID | Admitting: Nurse Practitioner

## 2024-05-27 ENCOUNTER — Other Ambulatory Visit: Payer: Self-pay | Admitting: Nurse Practitioner

## 2024-05-27 DIAGNOSIS — I10 Essential (primary) hypertension: Secondary | ICD-10-CM

## 2024-06-01 ENCOUNTER — Ambulatory Visit: Payer: MEDICAID | Admitting: Nurse Practitioner

## 2024-06-08 ENCOUNTER — Ambulatory Visit: Payer: MEDICAID | Admitting: Nurse Practitioner

## 2024-06-29 ENCOUNTER — Ambulatory Visit (INDEPENDENT_AMBULATORY_CARE_PROVIDER_SITE_OTHER): Payer: MEDICAID

## 2024-06-29 DIAGNOSIS — Z23 Encounter for immunization: Secondary | ICD-10-CM

## 2024-06-29 NOTE — Progress Notes (Signed)
 Pt received Flu  injection in Left  deltoid muscle. Pt tolerated it well with no complaints or concerns.

## 2024-07-03 ENCOUNTER — Other Ambulatory Visit: Payer: Self-pay | Admitting: Nurse Practitioner

## 2024-07-03 DIAGNOSIS — E119 Type 2 diabetes mellitus without complications: Secondary | ICD-10-CM

## 2024-07-08 ENCOUNTER — Other Ambulatory Visit (HOSPITAL_COMMUNITY): Payer: Self-pay

## 2024-07-09 ENCOUNTER — Other Ambulatory Visit (HOSPITAL_COMMUNITY): Payer: Self-pay

## 2024-07-10 ENCOUNTER — Other Ambulatory Visit (HOSPITAL_COMMUNITY): Payer: Self-pay

## 2024-07-10 ENCOUNTER — Telehealth: Payer: Self-pay

## 2024-07-10 NOTE — Telephone Encounter (Signed)
 Copied from CRM #[id number]. Topic: Clinical - Medication Prior Auth >> Jul 10, 2024  9:47 AM Pinkey ORN wrote: Reason for CRM: OZEMPIC , 0.25 OR 0.5 MG/DOSE, 2 MG/3ML SOPN >> Jul 10, 2024  9:48 AM Pinkey ORN wrote: Patient states he's needing a prior authorization, states he's already missed a few doses.

## 2024-07-10 NOTE — Telephone Encounter (Signed)
 I see the approval for Ozempic  I completed in Sep 2025. Did patient's insurance change? I tried to complete an eligibility search, and was not able to find any coverage. Last one listed on profile states it is expired. Can patient upload insurance information? If so.

## 2024-07-13 ENCOUNTER — Other Ambulatory Visit (HOSPITAL_COMMUNITY): Payer: Self-pay

## 2024-07-13 NOTE — Telephone Encounter (Unsigned)
 Copied from CRM [id number]. Topic: Clinical - Medication Prior Auth >> Jul 13, 2024  2:20 PM Mesmerise C wrote: Reason for CRM: Patient inquiring if there's any update for the prior authorization for Ozempic  has missed dose for last Monday and today as well confirmed insurance is [employer] and on file

## 2024-07-20 ENCOUNTER — Other Ambulatory Visit (HOSPITAL_COMMUNITY): Payer: Self-pay

## 2024-07-20 ENCOUNTER — Telehealth: Payer: Self-pay

## 2024-07-20 NOTE — Telephone Encounter (Signed)
 Patient called; was told by his Pharmacy that they need PA for Ozempic  been on it for two months then his insurance changed. Will have missed three shots by tomorrow. Has left messages in MyChart. Informed patient the message that stated that PA was no needed for med refill at this time and to tall the pharmacy and let them know.   Please call back [facility] to clarify that no PA is needed.   [facility] [address] [place] - [address] [place], [place] [address] Phone: [phone]  Fax: [phone]

## 2024-07-20 NOTE — Telephone Encounter (Signed)
 Pharmacy Patient Advocate Encounter   Received notification from Physician's Office that prior authorization for Ozempic  2mg /3 ml is required/requested.   Insurance verification completed.   The patient is insured through [facility] MEDICAID.   Per test claim: The current 28 day co-pay is, $4.  No PA needed at this time. This test claim was processed through [facility]- copay amounts may vary at other pharmacies due to pharmacy/plan contracts, or as the patient moves through the different stages of their insurance plan.        Patient has traditional [place] Medicaid. [employer] is not active.

## 2024-07-20 NOTE — Telephone Encounter (Signed)
 Pharmacy Patient Advocate Encounter   Received notification from Physician's Office that prior authorization for Ozempic  2mg /3 ml is required/requested.   Insurance verification completed.   The patient is insured through [place] MEDICAID.   Per test claim: The current 28 day co-pay is, $4.  No PA needed at this time. This test claim was processed through [facility]- copay amounts may vary at other pharmacies due to pharmacy/plan contracts, or as the patient moves through the different stages of their insurance plan.

## 2024-08-25 ENCOUNTER — Telehealth: Payer: Self-pay | Admitting: Nurse Practitioner

## 2024-08-25 NOTE — Telephone Encounter (Signed)
 Lm and sent MyChart message:  Your appointment on 09/03/2024 at 1pm needs to be rescheduled. Please reschedule through your MyChart or call the office.  E2C2 please reschedule appt.

## 2024-08-27 ENCOUNTER — Other Ambulatory Visit: Payer: Self-pay | Admitting: Nurse Practitioner

## 2024-08-27 DIAGNOSIS — E119 Type 2 diabetes mellitus without complications: Secondary | ICD-10-CM

## 2024-09-03 ENCOUNTER — Ambulatory Visit: Payer: MEDICAID | Admitting: Nurse Practitioner
